# Patient Record
Sex: Male | Born: 1960 | Race: White | Hispanic: No | Marital: Married | Smoking: Never smoker
Health system: Southern US, Community
[De-identification: ages and names within clinical notes are randomized; demographics above are authoritative.]

## PROBLEM LIST (undated history)

## (undated) DIAGNOSIS — N200 Calculus of kidney: Secondary | ICD-10-CM

## (undated) DIAGNOSIS — M1990 Unspecified osteoarthritis, unspecified site: Secondary | ICD-10-CM

## (undated) DIAGNOSIS — R42 Dizziness and giddiness: Secondary | ICD-10-CM

## (undated) HISTORY — DX: Calculus of kidney: N20.0

## (undated) HISTORY — DX: Dizziness and giddiness: R42

## (undated) HISTORY — DX: Unspecified osteoarthritis, unspecified site: M19.90

---

## 2014-03-26 ENCOUNTER — Ambulatory Visit: Payer: Self-pay | Admitting: Gastroenterology

## 2014-03-26 HISTORY — PX: COLONOSCOPY W/ POLYPECTOMY: SHX1380

## 2014-03-26 LAB — HM COLONOSCOPY

## 2015-10-07 ENCOUNTER — Encounter: Payer: Self-pay | Admitting: Family Medicine

## 2015-10-14 ENCOUNTER — Ambulatory Visit (INDEPENDENT_AMBULATORY_CARE_PROVIDER_SITE_OTHER): Payer: BC Managed Care – PPO | Admitting: Family Medicine

## 2015-10-14 ENCOUNTER — Encounter: Payer: Self-pay | Admitting: Family Medicine

## 2015-10-14 VITALS — BP 159/97 | HR 78 | Temp 97.7°F | Ht 68.5 in | Wt 199.0 lb

## 2015-10-14 DIAGNOSIS — Z13220 Encounter for screening for lipoid disorders: Secondary | ICD-10-CM

## 2015-10-14 DIAGNOSIS — Z0000 Encounter for general adult medical examination without abnormal findings: Secondary | ICD-10-CM

## 2015-10-14 DIAGNOSIS — Z1159 Encounter for screening for other viral diseases: Secondary | ICD-10-CM | POA: Diagnosis not present

## 2015-10-14 DIAGNOSIS — Z114 Encounter for screening for human immunodeficiency virus [HIV]: Secondary | ICD-10-CM | POA: Diagnosis not present

## 2015-10-14 DIAGNOSIS — Z131 Encounter for screening for diabetes mellitus: Secondary | ICD-10-CM | POA: Diagnosis not present

## 2015-10-14 DIAGNOSIS — Z1329 Encounter for screening for other suspected endocrine disorder: Secondary | ICD-10-CM

## 2015-10-14 LAB — UA/M W/RFLX CULTURE, ROUTINE
BILIRUBIN UA: NEGATIVE
GLUCOSE, UA: NEGATIVE
KETONES UA: NEGATIVE
Leukocytes, UA: NEGATIVE
NITRITE UA: NEGATIVE
Protein, UA: NEGATIVE
RBC, UA: NEGATIVE
Specific Gravity, UA: 1.01 (ref 1.005–1.030)
UUROB: 0.2 mg/dL (ref 0.2–1.0)
pH, UA: 5.5 (ref 5.0–7.5)

## 2015-10-14 NOTE — Patient Instructions (Signed)
Follow up as needed

## 2015-10-14 NOTE — Progress Notes (Signed)
BP (!) 159/97   Pulse 78   Temp 97.7 F (36.5 C)   Ht 5' 8.5" (1.74 m)   Wt 199 lb (90.3 kg)   SpO2 97%   BMI 29.82 kg/m    Subjective:    Patient ID: Chase Brooks, male    DOB: 01-31-1960, 55 y.o.   MRN: [id number]  HPI: Chase Brooks is a 54 y.o. male presenting on 10/14/2015 for comprehensive medical examination. Current medical complaints include:none  He currently lives with: wife Interim Problems from his last visit: no  Functional Status Survey:    FALL RISK: No flowsheet data found.  Depression Screen Depression screen [facility] 2/9 10/14/2015  Decreased Interest 0  Down, Depressed, Hopeless 0  PHQ - 2 Score 0    Advanced Directives <no information>  Past Medical History:  History reviewed. No pertinent past medical history.  Surgical History:  Past Surgical History:  Procedure Laterality Date  . COLONOSCOPY W/ POLYPECTOMY  03/26/2014   polypectomy x 2, repeat 5 years    Medications:  No current outpatient prescriptions on file prior to visit.   No current facility-administered medications on file prior to visit.     Allergies:  No Known Allergies  Social History:  Social History   Social History  . Marital status: Single    Spouse name: N/A  . Number of children: N/A  . Years of education: N/A   Occupational History  . Not on file.   Social History Main Topics  . Smoking status: Never Smoker  . Smokeless tobacco: Never Used  . Alcohol use No  . Drug use: No  . Sexual activity: Not on file   Other Topics Concern  . Not on file   Social History Narrative  . No narrative on file   History  Smoking Status  . Never Smoker  Smokeless Tobacco  . Never Used   History  Alcohol Use No    Family History:  Family History  Problem Relation Age of Onset  . Intracerebral hemorrhage Mother   . Cancer Neg Hx   . COPD Neg Hx   . Diabetes Neg Hx   . Heart disease Neg Hx   . Hypertension Neg Hx   . Stroke Neg Hx     Past  medical history, surgical history, medications, allergies, family history and social history reviewed with patient today and changes made to appropriate areas of the chart.   Review of Systems - General ROS: negative Psychological ROS: negative Ophthalmic ROS: negative ENT ROS: negative Respiratory ROS: no cough, shortness of breath, or wheezing Cardiovascular ROS: no chest pain or dyspnea on exertion Gastrointestinal ROS: no abdominal pain, change in bowel habits, or black or bloody stools Genito-Urinary ROS: no dysuria, trouble voiding, or hematuria Musculoskeletal ROS: negative Neurological ROS: no TIA or stroke symptoms Dermatological ROS: negative All other ROS negative except what is listed above and in the HPI.      Objective:    BP (!) 159/97   Pulse 78   Temp 97.7 F (36.5 C)   Ht 5' 8.5" (1.74 m)   Wt 199 lb (90.3 kg)   SpO2 97%   BMI 29.82 kg/m   Wt Readings from Last 3 Encounters:  10/14/15 199 lb (90.3 kg)  02/24/14 205 lb (93 kg)    Physical Exam  Constitutional: He is oriented to person, place, and time. He appears well-developed and well-nourished. No distress.  HENT:  Head: Atraumatic.  Right Ear: External  ear normal.  Left Ear: External ear normal.  Nose: Nose normal.  Mouth/Throat: Oropharynx is clear and moist. No oropharyngeal exudate.  Eyes: Conjunctivae are normal. No scleral icterus.  Neck: Normal range of motion. Neck supple. No thyromegaly present.  No carotid bruits  Cardiovascular: Normal rate, regular rhythm and normal heart sounds.   Pulmonary/Chest: Effort normal and breath sounds normal. No respiratory distress.  Abdominal: Soft. Bowel sounds are normal. He exhibits no distension. There is no tenderness.  Musculoskeletal: Normal range of motion. He exhibits no edema or tenderness.  Lymphadenopathy:    He has no cervical adenopathy.  Neurological: He is alert and oriented to person, place, and time. No cranial nerve deficit.  Skin: Skin  is warm and dry.  Psychiatric: He has a normal mood and affect. His behavior is normal.    Cognitive Testing - 6-CIT  Correct? Score   What year is it? yes 4 Yes = 0    No = 4  What month is it? yes 4 Yes = 0    No = 3  Remember:     Floyde Parkins, [address], [place]     What time is it? yes 4 Yes = 0    No = 3  Count backwards from 20 to 1 yes 4 Correct = 0    1 error = 2   More than 1 error = 4  Say the months of the year in reverse. yes 4 Correct = 0    1 error = 2   More than 1 error = 4  What address did I ask you to remember? yes 10 Correct = 0  1 error = 2    2 error = 4    3 error = 6    4 error = 8    All wrong = 10       TOTAL SCORE  30/28   Interpretation:  Normal  Normal (0-7) Abnormal (8-28)    Results for orders placed or performed in visit on 10/14/15  UA/M w/rflx Culture, Routine  Result Value Ref Range   Specific Gravity, UA 1.010 1.005 - 1.030   pH, UA 5.5 5.0 - 7.5   Color, UA Yellow Yellow   Appearance Ur Clear Clear   Leukocytes, UA Negative Negative   Protein, UA Negative Negative/Trace   Glucose, UA Negative Negative   Ketones, UA Negative Negative   RBC, UA Negative Negative   Bilirubin, UA Negative Negative   Urobilinogen, Ur 0.2 0.2 - 1.0 mg/dL   Nitrite, UA Negative Negative      Assessment & Plan:   Problem List Items Addressed This Visit    None    Visit Diagnoses    Annual physical exam    -  Primary   Await labs, exam benign today.    Relevant Orders   CBC with Differential/Platelet   Need for hepatitis C screening test       Relevant Orders   Hepatitis C Antibody   Encounter for screening for HIV       Relevant Orders   HIV antibody   Screening for diabetes mellitus       Relevant Orders   Comprehensive metabolic panel   UA/M w/rflx Culture, Routine (Completed)   HgB A1c   Screening for thyroid disorder       Relevant Orders   TSH   Screening for hyperlipidemia       Relevant Orders   Lipid Panel  w/o Chol/HDL Ratio        Preventative Services:  Health Risk Assessment and Personalized Prevention Plan: Bone Mass Measurements: CVD Screening:  Colon Cancer Screening:  Depression Screening:  Diabetes Screening:  Glaucoma Screening:  Hepatitis B vaccine: Hepatitis C screening:  HIV Screening: Flu Vaccine: Lung cancer Screening: Obesity Screening:  Pneumonia Vaccines (2): STI Screening: PSA screening:  Discussed aspirin prophylaxis for myocardial infarction prevention and decision was it was not indicated  LABORATORY TESTING:  Health maintenance labs ordered today as discussed above.   The natural history of prostate cancer and ongoing controversy regarding screening and potential treatment outcomes of prostate cancer has been discussed with the patient. The meaning of a false positive PSA and a false negative PSA has been discussed. He indicates understanding of the limitations of this screening test and wishes not to proceed with screening PSA testing.   IMMUNIZATIONS:   - Tdap: Tetanus vaccination status reviewed: last tetanus booster within 10 years. - Influenza: Refused  SCREENING: - Colonoscopy: Up to date  Discussed with patient purpose of the colonoscopy is to detect colon cancer at curable precancerous or early stages    PATIENT COUNSELING:    Sexuality: Discussed sexually transmitted diseases, partner selection, use of condoms, avoidance of unintended pregnancy  and contraceptive alternatives.   Advised to avoid cigarette smoking.  I discussed with the patient that most people either abstain from alcohol or drink within safe limits (<=14/week and <=4 drinks/occasion for males, <=7/weeks and <= 3 drinks/occasion for females) and that the risk for alcohol disorders and other health effects rises proportionally with the number of drinks per week and how often a drinker exceeds daily limits.  Discussed cessation/primary prevention of drug use and availability of treatment for abuse.    Diet: Encouraged to adjust caloric intake to maintain  or achieve ideal body weight, to reduce intake of dietary saturated fat and total fat, to limit sodium intake by avoiding high sodium foods and not adding table salt, and to maintain adequate dietary potassium and calcium preferably from fresh fruits, vegetables, and low-fat dairy products.    stressed the importance of regular exercise  Injury prevention: Discussed safety belts, safety helmets, smoke detector, smoking near bedding or upholstery.   Dental health: Discussed importance of regular tooth brushing, flossing, and dental visits.   Follow up plan: NEXT PREVENTATIVE PHYSICAL DUE IN 1 YEAR. Return in about 1 year (around 10/13/2016) for Physical Exam.

## 2015-10-15 LAB — COMPREHENSIVE METABOLIC PANEL
ALT: 12 IU/L (ref 0–44)
AST: 21 IU/L (ref 0–40)
Albumin/Globulin Ratio: 1.6 (ref 1.2–2.2)
Albumin: 4.7 g/dL (ref 3.5–5.5)
Alkaline Phosphatase: 95 IU/L (ref 39–117)
BILIRUBIN TOTAL: 0.5 mg/dL (ref 0.0–1.2)
BUN/Creatinine Ratio: 12 (ref 9–20)
BUN: 13 mg/dL (ref 6–24)
CALCIUM: 9.4 mg/dL (ref 8.7–10.2)
CHLORIDE: 98 mmol/L (ref 96–106)
CO2: 22 mmol/L (ref 18–29)
Creatinine, Ser: 1.07 mg/dL (ref 0.76–1.27)
GFR calc non Af Amer: 79 mL/min/{1.73_m2} (ref >59)
GFR, EST AFRICAN AMERICAN: 91 mL/min/{1.73_m2} (ref >59)
Globulin, Total: 2.9 g/dL (ref 1.5–4.5)
Glucose: 76 mg/dL (ref 65–99)
Potassium: 4.8 mmol/L (ref 3.5–5.2)
Sodium: 140 mmol/L (ref 134–144)
TOTAL PROTEIN: 7.6 g/dL (ref 6.0–8.5)

## 2015-10-15 LAB — HEMOGLOBIN A1C
Est. average glucose Bld gHb Est-mCnc: 117 mg/dL
HEMOGLOBIN A1C: 5.7 % — AB (ref 4.8–5.6)

## 2015-10-15 LAB — CBC WITH DIFFERENTIAL/PLATELET
BASOS: 1 %
Basophils Absolute: 0 10*3/uL (ref 0.0–0.2)
EOS (ABSOLUTE): 0.1 10*3/uL (ref 0.0–0.4)
Eos: 2 %
HEMOGLOBIN: 16.8 g/dL (ref 12.6–17.7)
Hematocrit: 47.4 % (ref 37.5–51.0)
IMMATURE GRANS (ABS): 0 10*3/uL (ref 0.0–0.1)
Immature Granulocytes: 0 %
LYMPHS: 33 %
Lymphocytes Absolute: 2.2 10*3/uL (ref 0.7–3.1)
MCH: 32.4 pg (ref 26.6–33.0)
MCHC: 35.4 g/dL (ref 31.5–35.7)
MCV: 91 fL (ref 79–97)
Monocytes Absolute: 0.6 10*3/uL (ref 0.1–0.9)
Monocytes: 9 %
NEUTROS ABS: 3.7 10*3/uL (ref 1.4–7.0)
Neutrophils: 55 %
PLATELETS: 234 10*3/uL (ref 150–379)
RBC: 5.19 x10E6/uL (ref 4.14–5.80)
RDW: 13 % (ref 12.3–15.4)
WBC: 6.6 10*3/uL (ref 3.4–10.8)

## 2015-10-15 LAB — HEPATITIS C ANTIBODY: Hep C Virus Ab: <0.1 s/co ratio (ref 0.0–0.9)

## 2015-10-15 LAB — TSH: TSH: 1.93 u[IU]/mL (ref 0.450–4.500)

## 2015-10-15 LAB — LIPID PANEL W/O CHOL/HDL RATIO
Cholesterol, Total: 182 mg/dL (ref 100–199)
HDL: 44 mg/dL (ref >39)
LDL Calculated: 115 mg/dL — ABNORMAL HIGH (ref 0–99)
Triglycerides: 117 mg/dL (ref 0–149)
VLDL CHOLESTEROL CAL: 23 mg/dL (ref 5–40)

## 2015-10-15 LAB — HIV ANTIBODY (ROUTINE TESTING W REFLEX): HIV SCREEN 4TH GENERATION: NONREACTIVE

## 2015-10-17 ENCOUNTER — Encounter: Payer: Self-pay | Admitting: Family Medicine

## 2016-06-26 ENCOUNTER — Encounter: Payer: Self-pay | Admitting: Family Medicine

## 2016-06-26 ENCOUNTER — Ambulatory Visit (INDEPENDENT_AMBULATORY_CARE_PROVIDER_SITE_OTHER): Payer: BC Managed Care – PPO | Admitting: Family Medicine

## 2016-06-26 VITALS — BP 151/95 | HR 79 | Temp 97.9°F | Wt 203.0 lb

## 2016-06-26 DIAGNOSIS — M25512 Pain in left shoulder: Secondary | ICD-10-CM

## 2016-06-26 MED ORDER — CYCLOBENZAPRINE HCL 10 MG PO TABS: 10.0000 mg | ORAL_TABLET | Freq: Three times a day (TID) | ORAL | 0 refills | Status: DC | PRN

## 2016-06-26 NOTE — Progress Notes (Signed)
BP (!) 151/95   Pulse 79   Temp 97.9 F (36.6 C)   Wt 203 lb (92.1 kg)   SpO2 98%   BMI 30.42 kg/m    Subjective:    Patient ID: Chase Brooks, male    DOB: Apr 08, 1960, 56 y.o.   MRN: [id number]  HPI: Chase Brooks is a 56 y.o. male  Chief Complaint  Patient presents with  . Underarm pain    x 1 month, left axillary area was tender/sore. Hasn't felt a knot. Has improved now, almost gone away.    Patient with 1 month of left axillary/chest wall tenderness. Denies known injury, mass, redness, rashes. Has improved quite a bit the past few days and is barely noticeable. Does also note some "crackling noises" when he moves that shoulder. Golfs quite a bit and was shoveling in the yard frequently the past month. Has not been taking anything for comfort.   Relevant past medical, surgical, family and social history reviewed and updated as indicated. Interim medical history since our last visit reviewed. Allergies and medications reviewed and updated.  Review of Systems  Constitutional: Negative.   HENT: Negative.   Respiratory: Negative.   Cardiovascular: Negative.   Gastrointestinal: Negative.   Genitourinary: Negative.   Musculoskeletal: Negative.   Skin: Negative.   Neurological: Negative.   Psychiatric/Behavioral: Negative.    Per HPI unless specifically indicated above     Objective:    BP (!) 151/95   Pulse 79   Temp 97.9 F (36.6 C)   Wt 203 lb (92.1 kg)   SpO2 98%   BMI 30.42 kg/m   Wt Readings from Last 3 Encounters:  06/26/16 203 lb (92.1 kg)  10/14/15 199 lb (90.3 kg)  02/24/14 205 lb (93 kg)    Physical Exam  Constitutional: He is oriented to person, place, and time. He appears well-developed and well-nourished. No distress.  HENT:  Head: Atraumatic.  Eyes: Conjunctivae are normal. Pupils are equal, round, and reactive to light.  Neck: Normal range of motion. Neck supple.  Cardiovascular: Normal rate and normal heart sounds.     Pulmonary/Chest: Effort normal and breath sounds normal. No respiratory distress.  Musculoskeletal: Normal range of motion. He exhibits no edema, tenderness (No TTP in area pt describes pain) or deformity.  Good strength and ROM in B/l UEs  Lymphadenopathy:  No palpable lymphadenopathy left axilla  Neurological: He is alert and oriented to person, place, and time.  Skin: Skin is warm and dry.  Psychiatric: He has a normal mood and affect. His behavior is normal.  Nursing note and vitals reviewed.   Results for orders placed or performed in visit on 06/26/16  CBC with Differential/Platelet  Result Value Ref Range   WBC 8.1 3.4 - 10.8 x10E3/uL   RBC 4.99 4.14 - 5.80 x10E6/uL   Hemoglobin 16.1 13.0 - 17.7 g/dL   Hematocrit 40.945.6 81.137.5 - 51.0 %   MCV 91 79 - 97 fL   MCH 32.3 26.6 - 33.0 pg   MCHC 35.3 31.5 - 35.7 g/dL   RDW 91.413.5 78.212.3 - 95.615.4 %   Platelets 215 150 - 379 x10E3/uL   Neutrophils 68 Not Estab. %   Lymphs 23 Not Estab. %   Monocytes 8 Not Estab. %   Eos 1 Not Estab. %   Basos 0 Not Estab. %   Neutrophils Absolute 5.4 1.4 - 7.0 x10E3/uL   Lymphocytes Absolute 1.9 0.7 - 3.1 x10E3/uL   Monocytes Absolute 0.7  0.1 - 0.9 x10E3/uL   EOS (ABSOLUTE) 0.1 0.0 - 0.4 x10E3/uL   Basophils Absolute 0.0 0.0 - 0.2 x10E3/uL   Immature Granulocytes 0 Not Estab. %   Immature Grans (Abs) 0.0 0.0 - 0.1 x10E3/uL      Assessment & Plan:   Problem List Items Addressed This Visit    None    Visit Diagnoses    Left anterior shoulder pain    -  Primary   Probable healing muscle strain - will send flexeril, stretches, massage, rest. Will check CBC as pt concerned about lymph nodes and such. F/u if no improvement   Relevant Orders   CBC with Differential/Platelet (Completed)       Follow up plan: Return if symptoms worsen or fail to improve.

## 2016-06-27 LAB — CBC WITH DIFFERENTIAL/PLATELET
BASOS ABS: 0 10*3/uL (ref 0.0–0.2)
Basos: 0 %
EOS (ABSOLUTE): 0.1 10*3/uL (ref 0.0–0.4)
EOS: 1 %
HEMATOCRIT: 45.6 % (ref 37.5–51.0)
HEMOGLOBIN: 16.1 g/dL (ref 13.0–17.7)
Immature Grans (Abs): 0 10*3/uL (ref 0.0–0.1)
Immature Granulocytes: 0 %
LYMPHS ABS: 1.9 10*3/uL (ref 0.7–3.1)
Lymphs: 23 %
MCH: 32.3 pg (ref 26.6–33.0)
MCHC: 35.3 g/dL (ref 31.5–35.7)
MCV: 91 fL (ref 79–97)
MONOCYTES: 8 %
Monocytes Absolute: 0.7 10*3/uL (ref 0.1–0.9)
NEUTROS ABS: 5.4 10*3/uL (ref 1.4–7.0)
Neutrophils: 68 %
Platelets: 215 10*3/uL (ref 150–379)
RBC: 4.99 x10E6/uL (ref 4.14–5.80)
RDW: 13.5 % (ref 12.3–15.4)
WBC: 8.1 10*3/uL (ref 3.4–10.8)

## 2016-10-19 ENCOUNTER — Encounter: Payer: Self-pay | Admitting: Family Medicine

## 2016-10-19 ENCOUNTER — Ambulatory Visit (INDEPENDENT_AMBULATORY_CARE_PROVIDER_SITE_OTHER): Payer: BC Managed Care – PPO | Admitting: Family Medicine

## 2016-10-19 VITALS — BP 142/96 | HR 78 | Ht 69.5 in | Wt 199.0 lb

## 2016-10-19 DIAGNOSIS — Z0000 Encounter for general adult medical examination without abnormal findings: Secondary | ICD-10-CM | POA: Diagnosis not present

## 2016-10-19 DIAGNOSIS — R030 Elevated blood-pressure reading, without diagnosis of hypertension: Secondary | ICD-10-CM

## 2016-10-19 LAB — MICROSCOPIC EXAMINATION: Bacteria, UA: NONE SEEN

## 2016-10-19 LAB — HEMOCCULT GUIAC POC 1CARD (OFFICE)

## 2016-10-19 MED ORDER — OXYCODONE-ACETAMINOPHEN 5-325 MG PO TABS: 1.0000 | ORAL_TABLET | Freq: Three times a day (TID) | ORAL | 0 refills | Status: DC | PRN

## 2016-10-19 MED ORDER — SULFAMETHOXAZOLE-TRIMETHOPRIM 800-160 MG PO TABS: 1.0000 | ORAL_TABLET | Freq: Two times a day (BID) | ORAL | 0 refills | Status: DC

## 2016-10-19 NOTE — Patient Instructions (Addendum)
Follow up in 6 months  DASH Eating Plan DASH stands for "Dietary Approaches to Stop Hypertension." The DASH eating plan is a healthy eating plan that has been shown to reduce high blood pressure (hypertension). It may also reduce your risk for type 2 diabetes, heart disease, and stroke. The DASH eating plan may also help with weight loss. What are tips for following this plan? General guidelines  Avoid eating more than 2,300 mg (milligrams) of salt (sodium) a day. If you have hypertension, you may need to reduce your sodium intake to 1,500 mg a day.  Limit alcohol intake to no more than 1 drink a day for nonpregnant women and 2 drinks a day for men. One drink equals 12 oz of beer, 5 oz of wine, or 1 oz of hard liquor.  Work with your health care provider to maintain a healthy body weight or to lose weight. Ask what an ideal weight is for you.  Get at least 30 minutes of exercise that causes your heart to beat faster (aerobic exercise) most days of the week. Activities may include walking, swimming, or biking.  Work with your health care provider or diet and nutrition specialist (dietitian) to adjust your eating plan to your individual calorie needs. Reading food labels  Check food labels for the amount of sodium per serving. Choose foods with less than 5 percent of the Daily Value of sodium. Generally, foods with less than 300 mg of sodium per serving fit into this eating plan.  To find whole grains, look for the word "whole" as the first word in the ingredient list. Shopping  Buy products labeled as "low-sodium" or "no salt added."  Buy fresh foods. Avoid canned foods and premade or frozen meals. Cooking  Avoid adding salt when cooking. Use salt-free seasonings or herbs instead of table salt or sea salt. Check with your health care provider or pharmacist before using salt substitutes.  Do not fry foods. Cook foods using healthy methods such as baking, boiling, grilling, and broiling  instead.  Cook with heart-healthy oils, such as olive, canola, soybean, or sunflower oil. Meal planning   Eat a balanced diet that includes: ? 5 or more servings of fruits and vegetables each day. At each meal, try to fill half of your plate with fruits and vegetables. ? Up to 6-8 servings of whole grains each day. ? Less than 6 oz of lean meat, poultry, or fish each day. A 3-oz serving of meat is about the same size as a deck of cards. One egg equals 1 oz. ? 2 servings of low-fat dairy each day. ? A serving of nuts, seeds, or beans 5 times each week. ? Heart-healthy fats. Healthy fats called Omega-3 fatty acids are found in foods such as flaxseeds and coldwater fish, like sardines, salmon, and mackerel.  Limit how much you eat of the following: ? Canned or prepackaged foods. ? Food that is high in trans fat, such as fried foods. ? Food that is high in saturated fat, such as fatty meat. ? Sweets, desserts, sugary drinks, and other foods with added sugar. ? Full-fat dairy products.  Do not salt foods before eating.  Try to eat at least 2 vegetarian meals each week.  Eat more home-cooked food and less restaurant, buffet, and fast food.  When eating at a restaurant, ask that your food be prepared with less salt or no salt, if possible. What foods are recommended? The items listed may not be a complete list.  Talk with your dietitian about what dietary choices are best for you. Grains Whole-grain or whole-wheat bread. Whole-grain or whole-wheat pasta. Brown rice. Orpah Cobb. Bulgur. Whole-grain and low-sodium cereals. Pita bread. Low-fat, low-sodium crackers. Whole-wheat flour tortillas. Vegetables Fresh or frozen vegetables (raw, steamed, roasted, or grilled). Low-sodium or reduced-sodium tomato and vegetable juice. Low-sodium or reduced-sodium tomato sauce and tomato paste. Low-sodium or reduced-sodium canned vegetables. Fruits All fresh, dried, or frozen fruit. Canned fruit in  natural juice (without added sugar). Meat and other protein foods Skinless chicken or [place]. Ground chicken or [place]. Pork with fat trimmed off. Fish and seafood. Egg whites. Dried beans, peas, or lentils. Unsalted nuts, nut butters, and seeds. Unsalted canned beans. Lean cuts of beef with fat trimmed off. Low-sodium, lean deli meat. Dairy Low-fat (1%) or fat-free (skim) milk. Fat-free, low-fat, or reduced-fat cheeses. Nonfat, low-sodium ricotta or cottage cheese. Low-fat or nonfat yogurt. Low-fat, low-sodium cheese. Fats and oils Soft margarine without trans fats. Vegetable oil. Low-fat, reduced-fat, or light mayonnaise and salad dressings (reduced-sodium). Canola, safflower, olive, soybean, and sunflower oils. Avocado. Seasoning and other foods Herbs. Spices. Seasoning mixes without salt. Unsalted popcorn and pretzels. Fat-free sweets. What foods are not recommended? The items listed may not be a complete list. Talk with your dietitian about what dietary choices are best for you. Grains Baked goods made with fat, such as croissants, muffins, or some breads. Dry pasta or rice meal packs. Vegetables Creamed or fried vegetables. Vegetables in a cheese sauce. Regular canned vegetables (not low-sodium or reduced-sodium). Regular canned tomato sauce and paste (not low-sodium or reduced-sodium). Regular tomato and vegetable juice (not low-sodium or reduced-sodium). Rosita Fire. Olives. Fruits Canned fruit in a light or heavy syrup. Fried fruit. Fruit in cream or butter sauce. Meat and other protein foods Fatty cuts of meat. Ribs. Fried meat. Tomasa Blase. Sausage. Bologna and other processed lunch meats. Salami. Fatback. Hotdogs. Bratwurst. Salted nuts and seeds. Canned beans with added salt. Canned or smoked fish. Whole eggs or egg yolks. Chicken or [place] with skin. Dairy Whole or 2% milk, cream, and half-and-half. Whole or full-fat cream cheese. Whole-fat or sweetened yogurt. Full-fat cheese. Nondairy  creamers. Whipped toppings. Processed cheese and cheese spreads. Fats and oils Butter. Stick margarine. Lard. Shortening. Ghee. Bacon fat. Tropical oils, such as coconut, palm kernel, or palm oil. Seasoning and other foods Salted popcorn and pretzels. Onion salt, garlic salt, seasoned salt, table salt, and sea salt. Worcestershire sauce. Tartar sauce. Barbecue sauce. Teriyaki sauce. Soy sauce, including reduced-sodium. Steak sauce. Canned and packaged gravies. Fish sauce. Oyster sauce. Cocktail sauce. Horseradish that you find on the shelf. Ketchup. Mustard. Meat flavorings and tenderizers. Bouillon cubes. Hot sauce and Tabasco sauce. Premade or packaged marinades. Premade or packaged taco seasonings. Relishes. Regular salad dressings.   Where to find more information:  National Heart, Lung, and Blood Institute: PopSteam.is  American Heart Association: www.heart.org Summary  The DASH eating plan is a healthy eating plan that has been shown to reduce high blood pressure (hypertension). It may also reduce your risk for type 2 diabetes, heart disease, and stroke.  With the DASH eating plan, you should limit salt (sodium) intake to 2,300 mg a day. If you have hypertension, you may need to reduce your sodium intake to 1,500 mg a day.  When on the DASH eating plan, aim to eat more fresh fruits and vegetables, whole grains, lean proteins, low-fat dairy, and heart-healthy fats.  Work with your health care provider or diet and nutrition specialist (dietitian)  to adjust your eating plan to your individual calorie needs. This information is not intended to replace advice given to you by your health care provider. Make sure you discuss any questions you have with your health care provider. Document Released: 12/21/2010 Document Revised: 12/26/2015 Document Reviewed: 12/26/2015 Elsevier Interactive Patient Education  2017 [employer].

## 2016-10-19 NOTE — Progress Notes (Signed)
BP (!) 142/96   Pulse 78   Ht 5' 9.5" (1.765 m)   Wt 199 lb (90.3 kg)   SpO2 96%   BMI 28.97 kg/m    Subjective:    Patient ID: Chase Brooks, male    DOB: April 09, 1960, 56 y.o.   MRN: [id number]  HPI: Kahron Kauth is a 56 y.o. male presenting on 10/19/2016 for comprehensive medical examination. Current medical complaints include:none  Did eat breakfast today. Checks BPs at home. Readings are 110-120s/70-80s at home.   Interim Problems from his last visit: no  Depression Screen done today and results listed below:  Depression screen [facility] 2/9 10/19/2016 10/14/2015  Decreased Interest 0 0  Down, Depressed, Hopeless 0 0  PHQ - 2 Score 0 0    The patient does not have a history of falls. I did not complete a risk assessment for falls. A plan of care for falls was not documented.   Past Medical History:  No past medical history on file.  Surgical History:  Past Surgical History:  Procedure Laterality Date  . COLONOSCOPY W/ POLYPECTOMY  03/26/2014   polypectomy x 2, repeat 5 years    Medications:  Current Outpatient Prescriptions on File Prior to Visit  Medication Sig  . Cyanocobalamin (B-12 PO) Take 500 mcg by mouth daily.    No current facility-administered medications on file prior to visit.     Allergies:  No Known Allergies  Social History:  Social History   Social History  . Marital status: Single    Spouse name: N/A  . Number of children: N/A  . Years of education: N/A   Occupational History  . Not on file.   Social History Main Topics  . Smoking status: Never Smoker  . Smokeless tobacco: Never Used  . Alcohol use No  . Drug use: No  . Sexual activity: Not on file   Other Topics Concern  . Not on file   Social History Narrative  . No narrative on file   History  Smoking Status  . Never Smoker  Smokeless Tobacco  . Never Used   History  Alcohol Use No    Family History:  Family History  Problem Relation Age of Onset  .  Intracerebral hemorrhage Mother   . Cancer Neg Hx   . COPD Neg Hx   . Diabetes Neg Hx   . Heart disease Neg Hx   . Hypertension Neg Hx   . Stroke Neg Hx     Past medical history, surgical history, medications, allergies, family history and social history reviewed with patient today and changes made to appropriate areas of the chart.   Review of Systems - General ROS: negative Psychological ROS: negative Ophthalmic ROS: negative ENT ROS: negative Respiratory ROS: no cough, shortness of breath, or wheezing Cardiovascular ROS: no chest pain or dyspnea on exertion Gastrointestinal ROS: no abdominal pain, change in bowel habits, or black or bloody stools Genito-Urinary ROS: no dysuria, trouble voiding, or hematuria Musculoskeletal ROS: negative Neurological ROS: no TIA or stroke symptoms Dermatological ROS: negative All other ROS negative except what is listed above and in the HPI.      Objective:    BP (!) 142/96   Pulse 78   Ht 5' 9.5" (1.765 m)   Wt 199 lb (90.3 kg)   SpO2 96%   BMI 28.97 kg/m   Wt Readings from Last 3 Encounters:  10/19/16 199 lb (90.3 kg)  06/26/16 203 lb (92.1 kg)  10/14/15 199 lb (90.3 kg)    Physical Exam  Constitutional: He is oriented to person, place, and time. He appears well-developed and well-nourished. No distress.  HENT:  Head: Atraumatic.  Right Ear: External ear normal.  Left Ear: External ear normal.  Nose: Nose normal.  Mouth/Throat: Oropharynx is clear and moist.  Eyes: Pupils are equal, round, and reactive to light. Conjunctivae are normal. No scleral icterus.  Neck: Normal range of motion. Neck supple.  Cardiovascular: Normal rate, regular rhythm, normal heart sounds and intact distal pulses.   No murmur heard. Pulmonary/Chest: Effort normal and breath sounds normal. No respiratory distress.  Abdominal: Soft. Bowel sounds are normal. He exhibits no distension and no mass. There is no tenderness. There is no guarding.    Genitourinary: Rectum normal and prostate normal.  Musculoskeletal: Normal range of motion. He exhibits no edema or tenderness.  Neurological: He is alert and oriented to person, place, and time. He has normal reflexes.  Skin: Skin is warm and dry. No rash noted.  Psychiatric: He has a normal mood and affect. His behavior is normal.  Nursing note and vitals reviewed.  Results for orders placed or performed in visit on 10/19/16  Microscopic Examination  Result Value Ref Range   WBC, UA 6-10 (A) 0 - 5 /hpf   RBC, UA 3-10 (A) 0 - 2 /hpf   Epithelial Cells (non renal) CANCELED    Bacteria, UA None seen None seen/Few  Urine Culture, Reflex  Result Value Ref Range   Urine Culture, Routine Final report    Organism ID, Bacteria No growth   CBC with Differential/Platelet  Result Value Ref Range   WBC 7.5 3.4 - 10.8 x10E3/uL   RBC 5.07 4.14 - 5.80 x10E6/uL   Hemoglobin 16.2 13.0 - 17.7 g/dL   Hematocrit 45.4 09.8 - 51.0 %   MCV 93 79 - 97 fL   MCH 32.0 26.6 - 33.0 pg   MCHC 34.5 31.5 - 35.7 g/dL   RDW 11.9 14.7 - 82.9 %   Platelets 215 150 - 379 x10E3/uL   Neutrophils 64 Not Estab. %   Lymphs 26 Not Estab. %   Monocytes 9 Not Estab. %   Eos 1 Not Estab. %   Basos 0 Not Estab. %   Neutrophils Absolute 4.7 1.4 - 7.0 x10E3/uL   Lymphocytes Absolute 2.0 0.7 - 3.1 x10E3/uL   Monocytes Absolute 0.7 0.1 - 0.9 x10E3/uL   EOS (ABSOLUTE) 0.1 0.0 - 0.4 x10E3/uL   Basophils Absolute 0.0 0.0 - 0.2 x10E3/uL   Immature Granulocytes 0 Not Estab. %   Immature Grans (Abs) 0.0 0.0 - 0.1 x10E3/uL  Comprehensive metabolic panel  Result Value Ref Range   Glucose 78 65 - 99 mg/dL   BUN 12 6 - 24 mg/dL   Creatinine, Ser 5.62 0.76 - 1.27 mg/dL   GFR calc non Af Amer 87 >59 mL/min/1.73   GFR calc Af Amer 101 >59 mL/min/1.73   BUN/Creatinine Ratio 12 9 - 20   Sodium 141 134 - 144 mmol/L   Potassium 4.3 3.5 - 5.2 mmol/L   Chloride 105 96 - 106 mmol/L   CO2 22 20 - 29 mmol/L   Calcium 9.2 8.7 - 10.2  mg/dL   Total Protein 7.2 6.0 - 8.5 g/dL   Albumin 4.4 3.5 - 5.5 g/dL   Globulin, Total 2.8 1.5 - 4.5 g/dL   Albumin/Globulin Ratio 1.6 1.2 - 2.2   Bilirubin Total 0.5 0.0 - 1.2 mg/dL  Alkaline Phosphatase 90 39 - 117 IU/L   AST 20 0 - 40 IU/L   ALT 13 0 - 44 IU/L  Lipid Panel w/o Chol/HDL Ratio  Result Value Ref Range   Cholesterol, Total 165 100 - 199 mg/dL   Triglycerides 161 0 - 149 mg/dL   HDL 43 >09 mg/dL   VLDL Cholesterol Cal 21 5 - 40 mg/dL   LDL Calculated 604 (H) 0 - 99 mg/dL  UA/M w/rflx Culture, Routine  Result Value Ref Range   Specific Gravity, UA 1.025 1.005 - 1.030   pH, UA 6.0 5.0 - 7.5   Color, UA Yellow Yellow   Appearance Ur Cloudy (A) Clear   Leukocytes, UA Negative Negative   Protein, UA 1+ (A) Negative/Trace   Glucose, UA Negative Negative   Ketones, UA Negative Negative   RBC, UA Trace (A) Negative   Bilirubin, UA Negative Negative   Urobilinogen, Ur 0.2 0.2 - 1.0 mg/dL   Nitrite, UA Negative Negative   Microscopic Examination See below:    Urinalysis Reflex Comment   POCT Occult Blood Stool  Result Value Ref Range   Fecal Occult Blood, POC  Negative   Card #1 Date     Card #2 Fecal Occult Blod, POC     Card #2 Date     Card #3 Fecal Occult Blood, POC     Card #3 Date        Assessment & Plan:   Problem List Items Addressed This Visit    None    Visit Diagnoses    Annual physical exam    -  Primary   Non-fasting labs drawn. Hemocult minimally positive today, recheck at next visit, await CBC. Pt declines notice of frank blood in stools. UTD on colonoscopy   Relevant Orders   CBC with Differential/Platelet (Completed)   Comprehensive metabolic panel (Completed)   Lipid Panel w/o Chol/HDL Ratio (Completed)   UA/M w/rflx Culture, Routine (Completed)   POCT Occult Blood Stool (Completed)   Elevated BP without diagnosis of hypertension       Normal readings at home, pt declining low-dose BP meds. Will bring home machine to next visit to  check against manual cuff. Lifestyle modifications discussed       Discussed aspirin prophylaxis for myocardial infarction prevention and decision was made to continue ASA  LABORATORY TESTING:  Health maintenance labs ordered today as discussed above.   The natural history of prostate cancer and ongoing controversy regarding screening and potential treatment outcomes of prostate cancer has been discussed with the patient. The meaning of a false positive PSA and a false negative PSA has been discussed. He indicates understanding of the limitations of this screening test and wishes not to proceed with screening PSA testing.   IMMUNIZATIONS:   - Tdap: Tetanus vaccination status reviewed: last tetanus booster within 10 years. - Influenza: Refused  SCREENING: - Colonoscopy: Up to date  Discussed with patient purpose of the colonoscopy is to detect colon cancer at curable precancerous or early stages   PATIENT COUNSELING:    Sexuality: Discussed sexually transmitted diseases, partner selection, use of condoms, avoidance of unintended pregnancy  and contraceptive alternatives.   Advised to avoid cigarette smoking.  I discussed with the patient that most people either abstain from alcohol or drink within safe limits (<=14/week and <=4 drinks/occasion for males, <=7/weeks and <= 3 drinks/occasion for females) and that the risk for alcohol disorders and other health effects rises proportionally with the number of  drinks per week and how often a drinker exceeds daily limits.  Discussed cessation/primary prevention of drug use and availability of treatment for abuse.   Diet: Encouraged to adjust caloric intake to maintain  or achieve ideal body weight, to reduce intake of dietary saturated fat and total fat, to limit sodium intake by avoiding high sodium foods and not adding table salt, and to maintain adequate dietary potassium and calcium preferably from fresh fruits, vegetables, and low-fat  dairy products.    stressed the importance of regular exercise  Injury prevention: Discussed safety belts, safety helmets, smoke detector, smoking near bedding or upholstery.   Dental health: Discussed importance of regular tooth brushing, flossing, and dental visits.   Follow up plan: NEXT PREVENTATIVE PHYSICAL DUE IN 1 YEAR. Return in about 6 months (around 04/19/2017) for BP.

## 2016-10-20 LAB — CBC WITH DIFFERENTIAL/PLATELET
BASOS: 0 %
Basophils Absolute: 0 10*3/uL (ref 0.0–0.2)
EOS (ABSOLUTE): 0.1 10*3/uL (ref 0.0–0.4)
EOS: 1 %
HEMATOCRIT: 46.9 % (ref 37.5–51.0)
HEMOGLOBIN: 16.2 g/dL (ref 13.0–17.7)
Immature Grans (Abs): 0 10*3/uL (ref 0.0–0.1)
Immature Granulocytes: 0 %
LYMPHS: 26 %
Lymphocytes Absolute: 2 10*3/uL (ref 0.7–3.1)
MCH: 32 pg (ref 26.6–33.0)
MCHC: 34.5 g/dL (ref 31.5–35.7)
MCV: 93 fL (ref 79–97)
MONOS ABS: 0.7 10*3/uL (ref 0.1–0.9)
Monocytes: 9 %
NEUTROS PCT: 64 %
Neutrophils Absolute: 4.7 10*3/uL (ref 1.4–7.0)
Platelets: 215 10*3/uL (ref 150–379)
RBC: 5.07 x10E6/uL (ref 4.14–5.80)
RDW: 13.3 % (ref 12.3–15.4)
WBC: 7.5 10*3/uL (ref 3.4–10.8)

## 2016-10-20 LAB — LIPID PANEL W/O CHOL/HDL RATIO
Cholesterol, Total: 165 mg/dL (ref 100–199)
HDL: 43 mg/dL (ref >39)
LDL Calculated: 101 mg/dL — ABNORMAL HIGH (ref 0–99)
Triglycerides: 106 mg/dL (ref 0–149)
VLDL Cholesterol Cal: 21 mg/dL (ref 5–40)

## 2016-10-20 LAB — COMPREHENSIVE METABOLIC PANEL
ALBUMIN: 4.4 g/dL (ref 3.5–5.5)
ALK PHOS: 90 IU/L (ref 39–117)
ALT: 13 IU/L (ref 0–44)
AST: 20 IU/L (ref 0–40)
Albumin/Globulin Ratio: 1.6 (ref 1.2–2.2)
BUN / CREAT RATIO: 12 (ref 9–20)
BUN: 12 mg/dL (ref 6–24)
Bilirubin Total: 0.5 mg/dL (ref 0.0–1.2)
CALCIUM: 9.2 mg/dL (ref 8.7–10.2)
CO2: 22 mmol/L (ref 20–29)
CREATININE: 0.98 mg/dL (ref 0.76–1.27)
Chloride: 105 mmol/L (ref 96–106)
GFR calc Af Amer: 101 mL/min/{1.73_m2} (ref >59)
GFR, EST NON AFRICAN AMERICAN: 87 mL/min/{1.73_m2} (ref >59)
GLOBULIN, TOTAL: 2.8 g/dL (ref 1.5–4.5)
Glucose: 78 mg/dL (ref 65–99)
Potassium: 4.3 mmol/L (ref 3.5–5.2)
SODIUM: 141 mmol/L (ref 134–144)
Total Protein: 7.2 g/dL (ref 6.0–8.5)

## 2016-10-21 LAB — URINE CULTURE, REFLEX: ORGANISM ID, BACTERIA: NO GROWTH

## 2016-10-21 LAB — UA/M W/RFLX CULTURE, ROUTINE
BILIRUBIN UA: NEGATIVE
GLUCOSE, UA: NEGATIVE
KETONES UA: NEGATIVE
Leukocytes, UA: NEGATIVE
NITRITE UA: NEGATIVE
PH UA: 6 (ref 5.0–7.5)
SPEC GRAV UA: 1.025 (ref 1.005–1.030)
UUROB: 0.2 mg/dL (ref 0.2–1.0)

## 2017-03-07 ENCOUNTER — Emergency Department
Admission: EM | Admit: 2017-03-07 | Discharge: 2017-03-07 | Disposition: A | Discharge status: Home / Self Care | Payer: BC Managed Care – PPO | Attending: Emergency Medicine | Admitting: Emergency Medicine

## 2017-03-07 ENCOUNTER — Encounter: Payer: Self-pay | Admitting: Emergency Medicine

## 2017-03-07 ENCOUNTER — Other Ambulatory Visit: Payer: Self-pay

## 2017-03-07 DIAGNOSIS — R42 Dizziness and giddiness: Secondary | ICD-10-CM

## 2017-03-07 DIAGNOSIS — Z7982 Long term (current) use of aspirin: Secondary | ICD-10-CM | POA: Diagnosis not present

## 2017-03-07 DIAGNOSIS — Z79899 Other long term (current) drug therapy: Secondary | ICD-10-CM | POA: Diagnosis not present

## 2017-03-07 DIAGNOSIS — J0100 Acute maxillary sinusitis, unspecified: Secondary | ICD-10-CM

## 2017-03-07 LAB — COMPREHENSIVE METABOLIC PANEL
ALBUMIN: 4 g/dL (ref 3.5–5.0)
ALK PHOS: 101 U/L (ref 38–126)
ALT: 19 U/L (ref 17–63)
ANION GAP: 9 (ref 5–15)
AST: 28 U/L (ref 15–41)
BUN: 16 mg/dL (ref 6–20)
CO2: 26 mmol/L (ref 22–32)
CREATININE: 0.99 mg/dL (ref 0.61–1.24)
Calcium: 8.8 mg/dL — ABNORMAL LOW (ref 8.9–10.3)
Chloride: 105 mmol/L (ref 101–111)
GFR calc Af Amer: >60 mL/min (ref >60)
GFR calc non Af Amer: >60 mL/min (ref >60)
GLUCOSE: 112 mg/dL — AB (ref 65–99)
Potassium: 3.9 mmol/L (ref 3.5–5.1)
SODIUM: 140 mmol/L (ref 135–145)
Total Bilirubin: 0.8 mg/dL (ref 0.3–1.2)
Total Protein: 7.5 g/dL (ref 6.5–8.1)

## 2017-03-07 LAB — CBC
HEMATOCRIT: 50.3 % (ref 40.0–52.0)
HEMOGLOBIN: 17.2 g/dL (ref 13.0–18.0)
MCH: 31.8 pg (ref 26.0–34.0)
MCHC: 34.2 g/dL (ref 32.0–36.0)
MCV: 93.1 fL (ref 80.0–100.0)
Platelets: 223 10*3/uL (ref 150–440)
RBC: 5.4 MIL/uL (ref 4.40–5.90)
RDW: 12.4 % (ref 11.5–14.5)
WBC: 13.6 10*3/uL — AB (ref 3.8–10.6)

## 2017-03-07 LAB — TROPONIN I: Troponin I: <0.03 ng/mL (ref <0.03)

## 2017-03-07 MED ORDER — MECLIZINE HCL 25 MG PO TABS: 25.0000 mg | ORAL_TABLET | Freq: Three times a day (TID) | ORAL | 0 refills | Status: DC | PRN

## 2017-03-07 MED ORDER — AMOXICILLIN-POT CLAVULANATE 875-125 MG PO TABS: 1.0000 | ORAL_TABLET | Freq: Two times a day (BID) | ORAL | 0 refills | Status: AC

## 2017-03-07 MED ORDER — SODIUM CHLORIDE 0.9 % IV SOLN
1000.0000 mL | Freq: Once | INTRAVENOUS | Status: AC
  Administered 2017-03-07: 1000 mL via INTRAVENOUS

## 2017-03-07 MED ORDER — MECLIZINE HCL 25 MG PO TABS
25.0000 mg | ORAL_TABLET | Freq: Once | ORAL | Status: AC
  Administered 2017-03-07: 25 mg via ORAL
  Filled 2017-03-07: qty 1

## 2017-03-07 NOTE — ED Notes (Signed)
Patient ambulatory to lobby with steady gait, NAD noted. Verbalized understanding of discharge instructions and followup care.  

## 2017-03-07 NOTE — ED Triage Notes (Signed)
Pt with dizziness that started on Tuesday when he woke up. Pt states that it seems to correspond with position change. Pt also states that his BP has been elevated, 163/102 in triage, and has had sinus issues for 2 weeks.

## 2017-03-07 NOTE — ED Provider Notes (Signed)
[facility] Emergency Department Provider Note   ____________________________________________    I have reviewed the triage vital signs and the nursing notes.   HISTORY  Chief Complaint Dizziness     HPI Chase Brooks is a 57 y.o. male who presents with complaints of dizziness.  Patient reports symptoms started on Tuesday morning.  He reports when he initially sat up from bed that morning he felt like the room was moving.  This resolved after holding still.  He stayed home from work that day and had similar symptoms in the morning on Wednesday did go to work.  Generally he felt well except if he turned his head in certain ways and he would feel slight disequilibrium.  No lightheadedness.  No palpitations.  No shortness of breath.  No recent travel.  No calf pain or swelling.  No pleurisy.  Has had a sinus infection over the last 2 weeks, pressure in his right maxillary sinus area.  Frequent popping of the right ear.  No ringing or tinnitus  History reviewed. No pertinent past medical history.  There are no active problems to display for this patient.   Past Surgical History:  Procedure Laterality Date  . COLONOSCOPY W/ POLYPECTOMY  03/26/2014   polypectomy x 2, repeat 5 years    Prior to Admission medications   Medication Sig Start Date End Date Taking? Authorizing Provider  aspirin EC 81 MG tablet Take 81 mg by mouth daily.   Yes [provider]  Cyanocobalamin (B-12 PO) Take 500 mcg by mouth daily.    Yes [provider]  amoxicillin-clavulanate (AUGMENTIN) 875-125 MG tablet Take 1 tablet by mouth 2 (two) times daily for 7 days. 03/07/17 03/14/17  Jene Every, MD  meclizine (ANTIVERT) 25 MG tablet Take 1 tablet (25 mg total) by mouth 3 (three) times daily as needed for dizziness. 03/07/17   Jene Every, MD     Allergies Patient has no known allergies.  Family History  Problem Relation Age of Onset  . Intracerebral  hemorrhage Mother   . Cancer Neg Hx   . COPD Neg Hx   . Diabetes Neg Hx   . Heart disease Neg Hx   . Hypertension Neg Hx   . Stroke Neg Hx     Social History Social History   Tobacco Use  . Smoking status: Never Smoker  . Smokeless tobacco: Never Used  Substance Use Topics  . Alcohol use: No  . Drug use: No    Review of Systems  Constitutional: Dizziness as above Eyes: No changes in vision.  ENT: No neck pain Cardiovascular: Denies chest pain.  Palpitations Respiratory: Denies shortness of breath. Gastrointestinal:   No nausea, no vomiting.   Genitourinary: Negative for dysuria. Musculoskeletal: Negative for myalgias Skin: Negative for rash. Neurological: Negative for headaches or weakness   ____________________________________________   PHYSICAL EXAM:  VITAL SIGNS: ED Triage Vitals  Enc Vitals Group     BP 03/07/17 0733 (!) 163/102     Pulse --      Resp --      Temp 03/07/17 0733 98.1 F (36.7 C)     Temp Source 03/07/17 0733 Oral     SpO2 03/07/17 0733 98 %     Weight 03/07/17 0734 87.1 kg (192 lb)     Height 03/07/17 0734 1.727 m (5\' 8" )     Head Circumference --      Peak Flow --      Pain  Score 03/07/17 0800 0     Pain Loc --      Pain Edu? --      Excl. in GC? --     Constitutional: Alert and oriented. No acute distress. Pleasant and interactive Eyes: Conjunctivae are normal.  Horizontal nystagmus, PERRLA Head: Atraumatic. Ears: Fullness right tympanic membrane suggesting fluid, left TM normal Nose: No congestion/rhinnorhea. Mouth/Throat: Mucous membranes are moist.   Neck:  Painless ROM Cardiovascular: Normal rate, regular rhythm. Grossly normal heart sounds.  Good peripheral circulation. Respiratory: Normal respiratory effort.  No retractions.  Gastrointestinal: Soft and nontender. No distention.  Genitourinary: deferred Musculoskeletal: No lower extremity tenderness nor edema.  Warm and well perfused Neurologic:  Normal speech and  language. No gross focal neurologic deficits are appreciated.  Cranial nerves II through XII are normal, normal strength in all extremities Skin:  Skin is warm, dry and intact. No rash noted. Psychiatric: Mood and affect are normal. Speech and behavior are normal.  ____________________________________________   LABS (all labs ordered are listed, but only abnormal results are displayed)  Labs Reviewed  CBC - Abnormal; Notable for the following components:      Result Value   WBC 13.6 (*)    All other components within normal limits  COMPREHENSIVE METABOLIC PANEL - Abnormal; Notable for the following components:   Glucose, Bld 112 (*)    Calcium 8.8 (*)    All other components within normal limits  TROPONIN I   ____________________________________________  EKG  ED ECG REPORT I, Jene Everyobert Deaunna Olarte, the attending physician, personally viewed and interpreted this ECG.  Date: 03/07/2017  Rhythm: normal sinus rhythm QRS Axis: normal Intervals: normal ST/T Wave abnormalities: normal Narrative Interpretation: no evidence of acute ischemia  ____________________________________________  RADIOLOGY  None ____________________________________________   PROCEDURES  Procedure(s) performed: No  Procedures   Critical Care performed: No ____________________________________________   INITIAL IMPRESSION / ASSESSMENT AND PLAN / ED COURSE  Pertinent labs & imaging results that were available during my care of the patient were reviewed by me and considered in my medical decision making (see chart for details).  Patient presents with symptoms most consistent with vertigo, appears to be worst in the morning but occasionally has symptoms throughout the day over the last 2 days.  No neuro deficits.  No headache.  No changes in vision.  No cardiac symptoms.  Fullness in the right TMs adjusting fluid and recent sinus infection suggestive of BPV  Treat with IV fluids, check labs, give  meclizine and reevaluate  Patient reassured by normal labs, felt better after fluids and meclizine.  Workup most consistent with vertigo, will treat likely sinus infection which may be causing symptoms.  If no improvement over the next 48-72 hours follow-up with ENT for further evaluation    ____________________________________________   FINAL CLINICAL IMPRESSION(S) / ED DIAGNOSES  Final diagnoses:  Vertigo  Acute maxillary sinusitis, recurrence not specified        Note:  This document was prepared using Dragon voice recognition software and may include unintentional dictation errors.    Jene EveryKinner, Jamae Tison, MD 03/07/17 1137

## 2017-04-22 ENCOUNTER — Ambulatory Visit: Payer: BC Managed Care – PPO | Admitting: Family Medicine

## 2017-04-22 ENCOUNTER — Encounter: Payer: Self-pay | Admitting: Family Medicine

## 2017-04-22 VITALS — BP 136/85 | HR 65 | Temp 98.8°F | Ht 69.0 in | Wt 204.5 lb

## 2017-04-22 DIAGNOSIS — R030 Elevated blood-pressure reading, without diagnosis of hypertension: Secondary | ICD-10-CM

## 2017-04-22 DIAGNOSIS — H9201 Otalgia, right ear: Secondary | ICD-10-CM | POA: Diagnosis not present

## 2017-04-22 NOTE — Progress Notes (Signed)
BP 136/85 (BP Location: Right Arm, Patient Position: Sitting, Cuff Size: Normal)   Pulse 65   Temp 98.8 F (37.1 C) (Oral)   Ht 5\' 9"  (1.753 m)   Wt 204 lb 8 oz (92.8 kg)   SpO2 96%   BMI 30.20 kg/m    Subjective:    Patient ID: Chase Brooks, male    DOB: 08/10/1960, [age] y.o.   MRN: [id number]  HPI: Chase Brooks is a 57 y.o. male  Chief Complaint  Patient presents with  . Hypertension    6 month follow up. Doing well. Patient been checking his blood pressure at home and has had normal readings  . Bilateral Ear Pain    Was in ER 03/07/17 for Vertigo and Sinusitis. Still having ear pain. No longer dizzy.   Pt here today for 6 month BP f/u as his BPs were high at CPE. Home BPs have been 130s/70s. Denies CP, SOB, HAs, visual changes. Has been working on [employer] diet and exercise.   Was in the ER about a month ago for dizziness, sinus infection probable vertigo. Sinus infection cleared up on amoxicillin but vertigo didn't clear up for 3 weeks.Went to ENT and was treated with maneuvers with good relief. Now having an ear ache after a boating trip this weekend but states the pain only occurred when the wind blew directly into his right ear. Once he put a hood over ears he was fine and no lingering sxs. Denies fevers, chills, rhinorrhea, dizziness, muffled hearing.   No past medical history on file.  Social History   Socioeconomic History  . Marital status: Married    Spouse name: Not on file  . Number of children: Not on file  . Years of education: Not on file  . Highest education level: Not on file  Occupational History  . Not on file  Social Needs  . Financial resource strain: Not on file  . Food insecurity:    Worry: Not on file    Inability: Not on file  . Transportation needs:    Medical: Not on file    Non-medical: Not on file  Tobacco Use  . Smoking status: Never Smoker  . Smokeless tobacco: Never Used  Substance and Sexual Activity  . Alcohol use: No  .  Drug use: No  . Sexual activity: Not on file  Lifestyle  . Physical activity:    Days per week: Not on file    Minutes per session: Not on file  . Stress: Not on file  Relationships  . Social connections:    Talks on phone: Not on file    Gets together: Not on file    Attends religious service: Not on file    Active member of club or organization: Not on file    Attends meetings of clubs or organizations: Not on file    Relationship status: Not on file  . Intimate partner violence:    Fear of current or ex partner: Not on file    Emotionally abused: Not on file    Physically abused: Not on file    Forced sexual activity: Not on file  Other Topics Concern  . Not on file  Social History Narrative  . Not on file   Relevant past medical, surgical, family and social history reviewed and updated as indicated. Interim medical history since our last visit reviewed. Allergies and medications reviewed and updated.  Review of Systems  Per HPI unless specifically indicated  above     Objective:    BP 136/85 (BP Location: Right Arm, Patient Position: Sitting, Cuff Size: Normal)   Pulse 65   Temp 98.8 F (37.1 C) (Oral)   Ht 5\' 9"  (1.753 m)   Wt 204 lb 8 oz (92.8 kg)   SpO2 96%   BMI 30.20 kg/m   Wt Readings from Last 3 Encounters:  04/22/17 204 lb 8 oz (92.8 kg)  03/07/17 192 lb (87.1 kg)  10/19/16 199 lb (90.3 kg)    Physical Exam  Constitutional: He is oriented to person, place, and time. He appears well-developed and well-nourished. No distress.  HENT:  Head: Atraumatic.  Right Ear: External ear normal.  Left Ear: External ear normal.  Nose: Nose normal.  Mouth/Throat: Oropharynx is clear and moist. No oropharyngeal exudate.  B/l TMs benign  Eyes: Pupils are equal, round, and reactive to light. Conjunctivae are normal.  Neck: Normal range of motion. Neck supple.  Cardiovascular: Normal rate and normal heart sounds.  Pulmonary/Chest: Effort normal and breath sounds  normal.  Musculoskeletal: Normal range of motion.  Neurological: He is alert and oriented to person, place, and time.  Skin: Skin is warm and dry.  Psychiatric: He has a normal mood and affect. His behavior is normal.  Nursing note and vitals reviewed.   Results for orders placed or performed during the hospital encounter of 03/07/17  CBC  Result Value Ref Range   WBC 13.6 (H) 3.8 - 10.6 K/uL   RBC 5.40 4.40 - 5.90 MIL/uL   Hemoglobin 17.2 13.0 - 18.0 g/dL   HCT 40.9 81.1 - 91.4 %   MCV 93.1 80.0 - 100.0 fL   MCH 31.8 26.0 - 34.0 pg   MCHC 34.2 32.0 - 36.0 g/dL   RDW 78.2 95.6 - 21.3 %   Platelets 223 150 - 440 K/uL  Comprehensive metabolic panel  Result Value Ref Range   Sodium 140 135 - 145 mmol/L   Potassium 3.9 3.5 - 5.1 mmol/L   Chloride 105 101 - 111 mmol/L   CO2 26 22 - 32 mmol/L   Glucose, Bld 112 (H) 65 - 99 mg/dL   BUN 16 6 - 20 mg/dL   Creatinine, Ser 0.86 0.61 - 1.24 mg/dL   Calcium 8.8 (L) 8.9 - 10.3 mg/dL   Total Protein 7.5 6.5 - 8.1 g/dL   Albumin 4.0 3.5 - 5.0 g/dL   AST 28 15 - 41 U/L   ALT 19 17 - 63 U/L   Alkaline Phosphatase 101 38 - 126 U/L   Total Bilirubin 0.8 0.3 - 1.2 mg/dL   GFR calc non Af Amer >60 >60 mL/min   GFR calc Af Amer >60 >60 mL/min   Anion gap 9 5 - 15  Troponin I  Result Value Ref Range   Troponin I <0.03 <0.03 ng/mL      Assessment & Plan:   Problem List Items Addressed This Visit    None    Visit Diagnoses    Elevated blood pressure, situational    -  Primary   BPs stable and WNL without medications. Continue home monitoring, DASH diet, increase exercise. F/u if home readings becoming persistently elevated   Right ear pain       Improved since recent boating trip, no lingering sxs. TMs benign. Continue to monitor, f/u with recurring sxs.        Follow up plan: Return in about 6 months (around 10/22/2017) for CPE after 10/5.

## 2017-04-22 NOTE — Patient Instructions (Signed)
Follow up for CPE 

## 2017-05-17 DIAGNOSIS — N200 Calculus of kidney: Secondary | ICD-10-CM | POA: Insufficient documentation

## 2017-06-06 ENCOUNTER — Encounter: Payer: Self-pay | Admitting: Family Medicine

## 2017-06-06 DIAGNOSIS — N200 Calculus of kidney: Secondary | ICD-10-CM

## 2017-06-11 ENCOUNTER — Telehealth: Payer: Self-pay | Admitting: Unknown Physician Specialty

## 2017-06-11 NOTE — Telephone Encounter (Signed)
Copied from CRM [id number]. Topic: Inquiry >> Jun 11, 2017  3:58 PM Yvonna Alanis wrote: Reason for CRM: Patient called requesting if Gabriel Cirri could work him in for an appt. Patient is having severe Kidney Stones. Patient states that he has been corresponding with Elnita Maxwell through [facility]. Please call patient at [phone].        Thank You!!!  >> Jun 11, 2017  4:44 PM Adela Ports M wrote: Please advise.  -----------------------------------------------------------------  Pt tells me he is having occasional pain.  We can se him next week as well

## 2017-06-18 ENCOUNTER — Ambulatory Visit (INDEPENDENT_AMBULATORY_CARE_PROVIDER_SITE_OTHER): Payer: BC Managed Care – PPO | Admitting: Unknown Physician Specialty

## 2017-06-18 ENCOUNTER — Encounter: Payer: Self-pay | Admitting: Unknown Physician Specialty

## 2017-06-18 VITALS — BP 143/87 | HR 62 | Temp 98.5°F | Ht 69.0 in | Wt 202.8 lb

## 2017-06-18 DIAGNOSIS — R311 Benign essential microscopic hematuria: Secondary | ICD-10-CM | POA: Diagnosis not present

## 2017-06-18 DIAGNOSIS — M545 Low back pain: Secondary | ICD-10-CM

## 2017-06-18 LAB — UA/M W/RFLX CULTURE, ROUTINE
BILIRUBIN UA: NEGATIVE
Glucose, UA: NEGATIVE
Ketones, UA: NEGATIVE
Leukocytes, UA: NEGATIVE
Nitrite, UA: NEGATIVE
PH UA: 5.5 (ref 5.0–7.5)
PROTEIN UA: NEGATIVE
Specific Gravity, UA: <1.005 — ABNORMAL LOW (ref 1.005–1.030)
UUROB: 0.2 mg/dL (ref 0.2–1.0)

## 2017-06-18 LAB — MICROSCOPIC EXAMINATION

## 2017-06-18 NOTE — Progress Notes (Signed)
BP (!) 143/87   Pulse 62   Temp 98.5 F (36.9 C) (Oral)   Ht 5\' 9"  (1.753 m)   Wt 202 lb 12.8 oz (92 kg)   SpO2 98%   BMI 29.95 kg/m    Subjective:    Patient ID: Chase Brooks, male    DOB: 06-06-1960, 57 y.o.   MRN: [id number]  HPI: Chase Brooks is a 57 y.o. male  Chief Complaint  Patient presents with  . Nephrolithiasis    pt states he thinks he may have a kidney stone on the left side, states he may have already passed it but he is not sure. States the pain comes and goes on the left side.    Pt states he was doing work around the house May 3.  States it got worse to a 9/10 and was SOB and in the bed.  States it eased off, rested over the night and felt better at a 3/10.  Was put on Tamulosin.  No more severe pain.  He had a short episode the other day.  States it feels like someone is pressing thumb left side.  Nothing seems to make the pain better less.    Relevant past medical, surgical, family and social history reviewed and updated as indicated. Interim medical history since our last visit reviewed. Allergies and medications reviewed and updated.  Review of Systems  Per HPI unless specifically indicated above     Objective:    BP (!) 143/87   Pulse 62   Temp 98.5 F (36.9 C) (Oral)   Ht 5\' 9"  (1.753 m)   Wt 202 lb 12.8 oz (92 kg)   SpO2 98%   BMI 29.95 kg/m   Wt Readings from Last 3 Encounters:  06/18/17 202 lb 12.8 oz (92 kg)  04/22/17 204 lb 8 oz (92.8 kg)  03/07/17 192 lb (87.1 kg)    Physical Exam  Constitutional: He is oriented to person, place, and time. He appears well-developed and well-nourished. No distress.  HENT:  Head: Normocephalic and atraumatic.  Eyes: Conjunctivae and lids are normal. Right eye exhibits no discharge. Left eye exhibits no discharge. No scleral icterus.  Neck: Normal range of motion. Neck supple. No JVD present. Carotid bruit is not present.  Cardiovascular: Normal rate, regular rhythm and normal heart sounds.    Pulmonary/Chest: Effort normal and breath sounds normal. No respiratory distress.  Abdominal: Normal appearance. There is no splenomegaly or hepatomegaly.  Musculoskeletal: Normal range of motion.  Neurological: He is alert and oriented to person, place, and time.  Skin: Skin is warm, dry and intact. No rash noted. No pallor.  Psychiatric: He has a normal mood and affect. His behavior is normal. Judgment and thought content normal.    Results for orders placed or performed during the hospital encounter of 03/07/17  CBC  Result Value Ref Range   WBC 13.6 (H) 3.8 - 10.6 K/uL   RBC 5.40 4.40 - 5.90 MIL/uL   Hemoglobin 17.2 13.0 - 18.0 g/dL   HCT 62.1 30.8 - 65.7 %   MCV 93.1 80.0 - 100.0 fL   MCH 31.8 26.0 - 34.0 pg   MCHC 34.2 32.0 - 36.0 g/dL   RDW 84.6 96.2 - 95.2 %   Platelets 223 150 - 440 K/uL  Comprehensive metabolic panel  Result Value Ref Range   Sodium 140 135 - 145 mmol/L   Potassium 3.9 3.5 - 5.1 mmol/L   Chloride 105 101 - 111  mmol/L   CO2 26 22 - 32 mmol/L   Glucose, Bld 112 (H) 65 - 99 mg/dL   BUN 16 6 - 20 mg/dL   Creatinine, Ser 4.090.99 0.61 - 1.24 mg/dL   Calcium 8.8 (L) 8.9 - 10.3 mg/dL   Total Protein 7.5 6.5 - 8.1 g/dL   Albumin 4.0 3.5 - 5.0 g/dL   AST 28 15 - 41 U/L   ALT 19 17 - 63 U/L   Alkaline Phosphatase 101 38 - 126 U/L   Total Bilirubin 0.8 0.3 - 1.2 mg/dL   GFR calc non Af Amer >60 >60 mL/min   GFR calc Af Amer >60 >60 mL/min   Anion gap 9 5 - 15  Troponin I  Result Value Ref Range   Troponin I <0.03 <0.03 ng/mL      Assessment & Plan:   Problem List Items Addressed This Visit    None    Visit Diagnoses    Left low back pain, unspecified chronicity, with sciatica presence unspecified    -  Primary   Probably due to stone.  Pending appt with Urology   Relevant Orders   UA/M w/rflx Culture, Routine   Benign essential microscopic hematuria       Probably due to stone.  Appt set up with Urology.  Appt not till next month.  In the meantime  drink lots of fluid and will keep pain meds from UC on hand   Relevant Orders   CK (Creatine Kinase)      Check CK due to report of earlier tea colored urine  Follow up plan: Return if symptoms worsen or fail to improve.

## 2017-06-19 LAB — CK: Total CK: 105 U/L (ref 24–204)

## 2017-06-24 ENCOUNTER — Encounter: Payer: Self-pay | Admitting: Unknown Physician Specialty

## 2017-08-06 ENCOUNTER — Ambulatory Visit
Admission: RE | Admit: 2017-08-06 | Discharge: 2017-08-06 | Disposition: A | Discharge status: Home / Self Care | Payer: BC Managed Care – PPO | Source: Ambulatory Visit | Attending: Urology | Admitting: Urology

## 2017-08-06 ENCOUNTER — Ambulatory Visit: Payer: BC Managed Care – PPO | Admitting: Urology

## 2017-08-06 ENCOUNTER — Encounter: Payer: Self-pay | Admitting: Urology

## 2017-08-06 ENCOUNTER — Other Ambulatory Visit: Payer: Self-pay

## 2017-08-06 VITALS — BP 163/103 | HR 84 | Ht 68.0 in | Wt 200.0 lb

## 2017-08-06 DIAGNOSIS — R93421 Abnormal radiologic findings on diagnostic imaging of right kidney: Secondary | ICD-10-CM | POA: Insufficient documentation

## 2017-08-06 DIAGNOSIS — N200 Calculus of kidney: Secondary | ICD-10-CM

## 2017-08-06 DIAGNOSIS — R109 Unspecified abdominal pain: Secondary | ICD-10-CM

## 2017-08-06 DIAGNOSIS — R3129 Other microscopic hematuria: Secondary | ICD-10-CM

## 2017-08-06 LAB — URINALYSIS, COMPLETE
BILIRUBIN UA: NEGATIVE
GLUCOSE, UA: NEGATIVE
Ketones, UA: NEGATIVE
Leukocytes, UA: NEGATIVE
Nitrite, UA: NEGATIVE
PROTEIN UA: NEGATIVE
RBC UA: NEGATIVE
SPEC GRAV UA: 1.02 (ref 1.005–1.030)
Urobilinogen, Ur: 0.2 mg/dL (ref 0.2–1.0)
pH, UA: 7.5 (ref 5.0–7.5)

## 2017-08-06 IMAGING — CR DG ABDOMEN 1V
1 series · 2 of 2 positions shown · non-contrast
Comparison: No prior.

CLINICAL DATA: Left-sided pain.  Possible nephrolithiasis.

EXAM:
ABDOMEN - 1 VIEW

[Series 1: dg abd 1 view · 0.14mm/px · 2 of 2 slices shown]
[im 1/2]
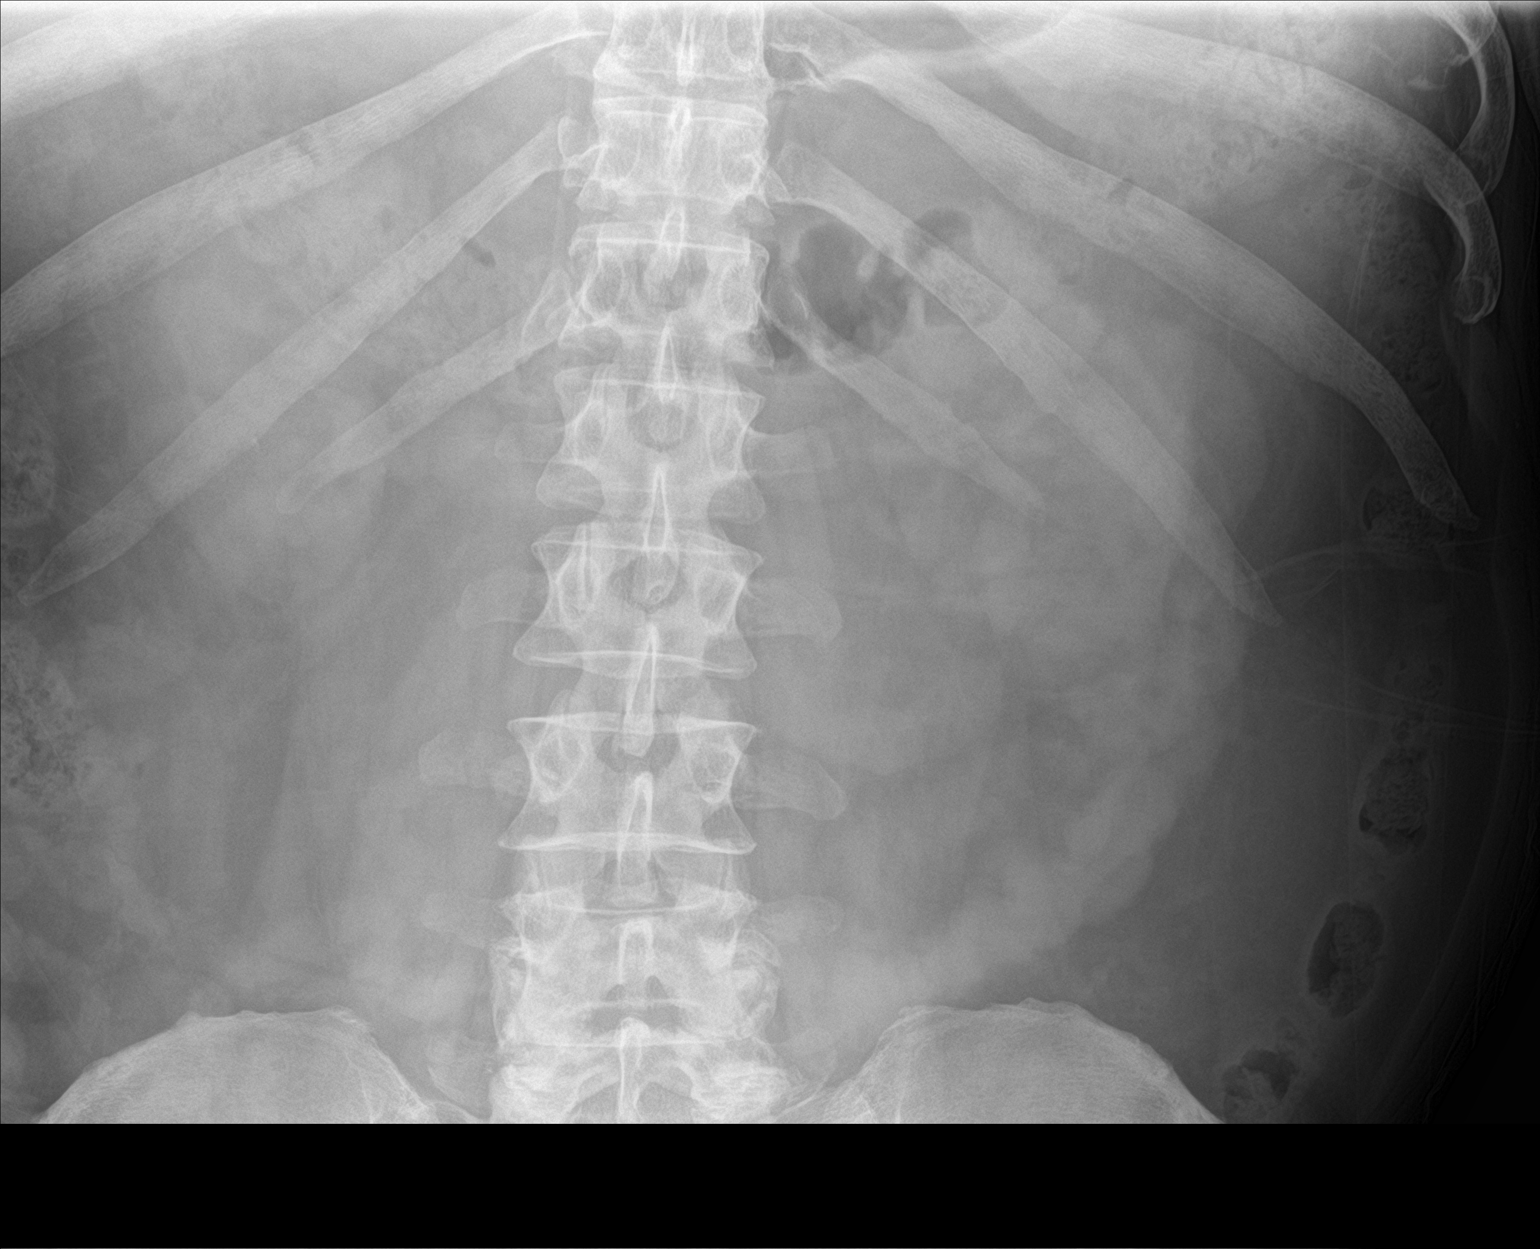
[im 2/2]
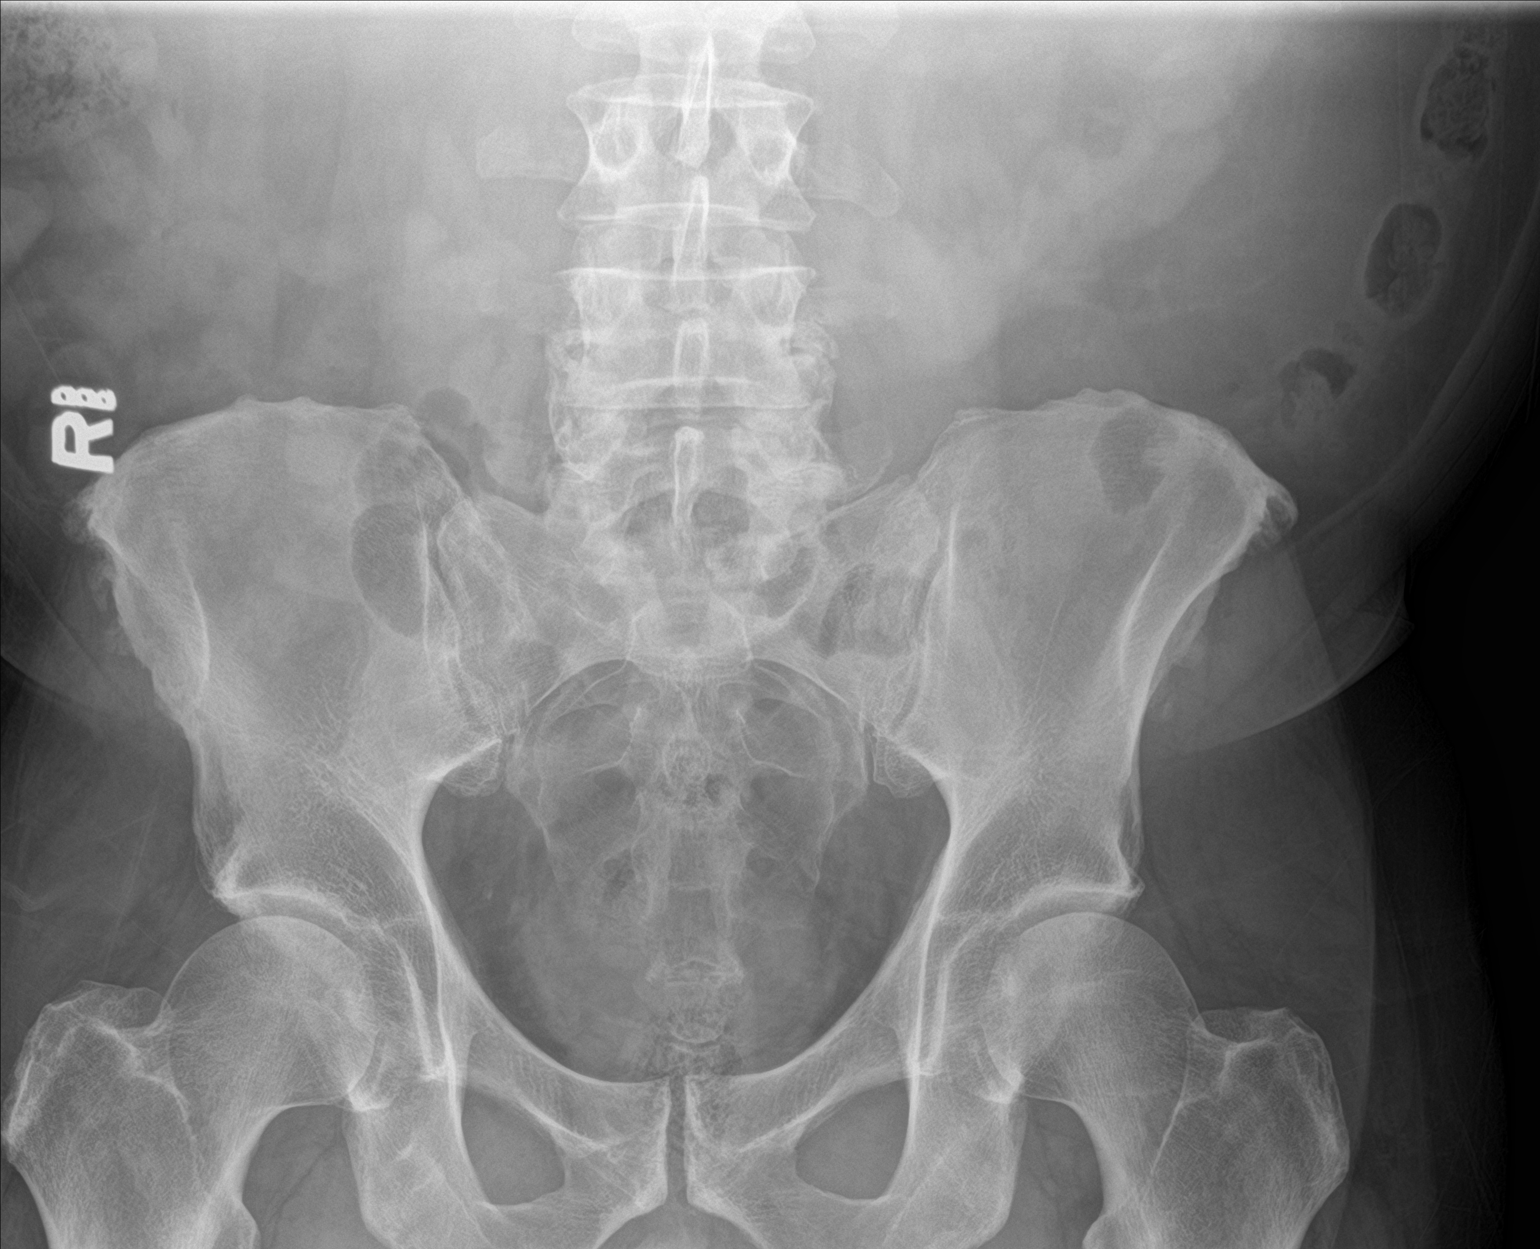

[2 of 2 positions shown; findings below may reference images not displayed]

FINDINGS: Soft tissue structures are unremarkable. Tiny punctate 2 mm calcific
density noted over the right lower renal pole suggesting
nephrolithiasis. Punctate calcific density over the right mid pelvis
most consistent. Distal right ureteral stone cannot be excluded. No
bowel distention or free air. No acute bony abnormality.
IMPRESSION: 1. Tiny punctate 2 mm calcific density noted over the right lower
renal pole suggesting nephrolithiasis.

2. Punctate calcific density noted the right mid pelvis most
consistent with phlebolith. Distal ureteral stone cannot be
excluded.

## 2017-08-06 NOTE — Progress Notes (Signed)
08/06/2017 8:55 AM   Chase SpringJoel Lee Brooks 10-08-1960 [id number]  Referring provider: Steele Sizerrissman, Mark A, MD [address] [place], [place] [id number]  Chief Complaint  Patient presents with  . Nephrolithiasis    New Patient    HPI: 57 year old male referred for further evaluation of possible kidney stones.  He was seen and evaluated on 05/18/2017 with severe left flank pain and microscopic hematuria possibly passing a kidney stone on the left.  No imaging was obtained at the time.    His pain slowly resoved other the next month with episodes of lessing severiy pain.  This lasted about 4 weeks.  Ultimately the pain migrated to the LLQ and penis.He followed up with his primary care physician and noted to have persistent microscopic blood (3-10 RBC/ HPF).  He was referred to urology for further evaluation.   He subsequenctly passed what he believes was the stone a few days alter.  His pain has fully resolved.  He is yet to have any imaging - he was sent from our clinic to get KUB and returned    No previous stone episodes.    He drinks primarily water.  He does not add extra salt.  He does work outside and gets dehydrated at times.    PMH: Past Medical History:  Diagnosis Date  . Nephrolithiasis   . Vertigo     Surgical History: Past Surgical History:  Procedure Laterality Date  . COLONOSCOPY W/ POLYPECTOMY  03/26/2014   polypectomy x 2, repeat 5 years    Home Medications:  Allergies as of 08/06/2017   No Known Allergies     Medication List        Accurate as of 08/06/17  8:55 AM. Always use your most recent med list.          aspirin EC 81 MG tablet Take 81 mg by mouth daily.   B-12 PO Take 500 mcg by mouth daily.       Allergies: No Known Allergies  Family History: Family History  Problem Relation Age of Onset  . Intracerebral hemorrhage Mother   . Cancer Neg Hx   . COPD Neg Hx   . Diabetes Neg Hx   . Heart disease Neg Hx   . Hypertension Neg Hx     . Stroke Neg Hx     Social History:  reports that he has never smoked. He has never used smokeless tobacco. He reports that he does not drink alcohol or use drugs.  ROS: UROLOGY Frequent Urination?: No Hard to postpone urination?: No Burning/pain with urination?: No Get up at night to urinate?: No Leakage of urine?: No Urine stream starts and stops?: No Trouble starting stream?: No Do you have to strain to urinate?: No Blood in urine?: No Urinary tract infection?: No Sexually transmitted disease?: No Injury to kidneys or bladder?: No Painful intercourse?: No Weak stream?: No Erection problems?: No Penile pain?: No  Gastrointestinal Nausea?: No Vomiting?: No Indigestion/heartburn?: No Diarrhea?: No Constipation?: No  Constitutional Fever: No Night sweats?: No Weight loss?: No Fatigue?: No  Skin Skin rash/lesions?: No Itching?: No  Eyes Blurred vision?: No Double vision?: No  Ears/Nose/Throat Sore throat?: No Sinus problems?: No  Hematologic/Lymphatic Swollen glands?: No Easy bruising?: Yes  Cardiovascular Leg swelling?: No Chest pain?: No  Respiratory Cough?: No Shortness of breath?: No  Endocrine Excessive thirst?: No  Musculoskeletal Back pain?: No Joint pain?: No  Neurological Headaches?: No Dizziness?: No  Psychologic Depression?: No Anxiety?: No  Physical Exam: BP (!) 163/103   Pulse 84   Ht 5\' 8"  (1.727 m)   Wt 200 lb (90.7 kg)   BMI 30.41 kg/m   Constitutional:  Alert and oriented, No acute distress. HEENT: [place] AT, moist mucus membranes.  Trachea midline, no masses. Cardiovascular: No clubbing, cyanosis, or edema. Respiratory: Normal respiratory effort, no increased work of breathing. GI: Abdomen is soft, nontender, nondistended, no abdominal masses GU: No CVA tenderness Skin: No rashes, bruises or suspicious lesions. Neurologic: Grossly intact, no focal deficits, moving all 4 extremities. Psychiatric: Normal mood and  affect.  Laboratory Data: Lab Results  Component Value Date   WBC 13.6 (H) 03/07/2017   HGB 17.2 03/07/2017   HCT 50.3 03/07/2017   MCV 93.1 03/07/2017   PLT 223 03/07/2017    Lab Results  Component Value Date   CREATININE 0.99 03/07/2017    Lab Results  Component Value Date   HGBA1C 5.7 (H) 10/14/2015    Urinalysis Results for orders placed or performed in visit on 08/06/17  Urinalysis, Complete  Result Value Ref Range   Specific Gravity, UA 1.020 1.005 - 1.030   pH, UA 7.5 5.0 - 7.5   Color, UA Yellow Yellow   Appearance Ur Clear Clear   Leukocytes, UA Negative Negative   Protein, UA Negative Negative/Trace   Glucose, UA Negative Negative   Ketones, UA Negative Negative   RBC, UA Negative Negative   Bilirubin, UA Negative Negative   Urobilinogen, Ur 0.2 0.2 - 1.0 mg/dL   Nitrite, UA Negative Negative     Pertinent Imaging: CLINICAL DATA:  Left-sided pain.  Possible nephrolithiasis.  EXAM: ABDOMEN - 1 VIEW  COMPARISON:  No prior.  FINDINGS: Soft tissue structures are unremarkable. Tiny punctate 2 mm calcific density noted over the right lower renal pole suggesting nephrolithiasis. Punctate calcific density over the right mid pelvis most consistent. Distal right ureteral stone cannot be excluded. No bowel distention or free air. No acute bony abnormality.  IMPRESSION: 1. Tiny punctate 2 mm calcific density noted over the right lower renal pole suggesting nephrolithiasis.  2. Punctate calcific density noted the right mid pelvis most consistent with phlebolith. Distal ureteral stone cannot be excluded.   Electronically Signed   By: Maisie Fus  Register   On: 08/06/2017 08:50  KUB personally reviewed, agree with above interpretation.  Assessment & Plan:    1. Microscopic hematuria Likely secondary to stone episode Microscopic hematuria has resolved today after resolution of his pain with presumed interval passage of his stone I have advised  him to have a repeat urinalysis within the next year at his primary care and to ensure that he has no persistent or recurrent microscopic blood - Urinalysis, Complete  2. Left flank pain Resolved KUB today shows no ureteral stones  3. Right nephrolithiasis Punctate stone in the right kidney on KUB, otherwise asymptomatic  We discussed general stone prevention techniques including drinking plenty water with goal of producing 2.5 L urine daily, increased citric acid intake, avoidance of high oxalate containing foods, and decreased salt intake.  Information about dietary recommendations given today.   Given the very small size of this right-sided stone, no indication for serial imaging and follow-up at this time  Return if he become symptomatic, prn  Vanna Scotland, MD  [facility] [address] [place], [place] [address] [phone]

## 2017-10-25 ENCOUNTER — Encounter: Payer: Self-pay | Admitting: Family Medicine

## 2017-10-25 ENCOUNTER — Ambulatory Visit (INDEPENDENT_AMBULATORY_CARE_PROVIDER_SITE_OTHER): Payer: BC Managed Care – PPO | Admitting: Family Medicine

## 2017-10-25 VITALS — BP 152/99 | HR 78 | Temp 98.0°F | Ht 68.0 in | Wt 203.1 lb

## 2017-10-25 DIAGNOSIS — Z0000 Encounter for general adult medical examination without abnormal findings: Secondary | ICD-10-CM

## 2017-10-25 DIAGNOSIS — I10 Essential (primary) hypertension: Secondary | ICD-10-CM | POA: Diagnosis not present

## 2017-10-25 LAB — MICROSCOPIC EXAMINATION
BACTERIA UA: NONE SEEN
WBC, UA: NONE SEEN /hpf (ref 0–5)

## 2017-10-25 LAB — UA/M W/RFLX CULTURE, ROUTINE
Bilirubin, UA: NEGATIVE
GLUCOSE, UA: NEGATIVE
Ketones, UA: NEGATIVE
Leukocytes, UA: NEGATIVE
NITRITE UA: NEGATIVE
PROTEIN UA: NEGATIVE
Specific Gravity, UA: 1.015 (ref 1.005–1.030)
Urobilinogen, Ur: 0.2 mg/dL (ref 0.2–1.0)
pH, UA: 7 (ref 5.0–7.5)

## 2017-10-25 NOTE — Assessment & Plan Note (Signed)
Does not wish to start medication, will continue working on lifestyle modifications and recheck next year. Does not want a 6 month f/u to recheck.

## 2017-10-25 NOTE — Patient Instructions (Signed)
Follow up in 1 year.

## 2017-10-25 NOTE — Progress Notes (Signed)
BP (!) 152/99 (BP Location: Left Arm, Cuff Size: Normal)   Pulse 78   Temp 98 F (36.7 C) (Oral)   Ht 5\' 8"  (1.727 m)   Wt 203 lb 1.6 oz (92.1 kg)   SpO2 97%   BMI 30.88 kg/m    Subjective:    Patient ID: Chase Brooks, male    DOB: 1960-12-27, 57 y.o.   MRN: [id number]  HPI: [name] is a 57 y.o. male presenting on 10/25/2017 for comprehensive medical examination. Current medical complaints include:none  Had an episode of vertigo recently which has now resolved.  Passed one kidney stone, and has one 2 mm stone left on the right. Some hematuria last weekend but that has since resolved.   Does not check BPs at home, hx of elevated BPs. Does not want to start any medications. Working on [employer] and weight loss. Denies CP, SOB, dizziness, HAs.   He currently lives with: Interim Problems from his last visit: no  Depression Screen done today and results listed below:  Depression screen [facility] 2/9 10/25/2017 10/19/2016 10/14/2015  Decreased Interest 0 0 0  Down, Depressed, Hopeless 0 0 0  PHQ - 2 Score 0 0 0  Altered sleeping 0 - -  Tired, decreased energy 0 - -  Change in appetite 0 - -  Feeling bad or failure about yourself  0 - -  Trouble concentrating 0 - -  Moving slowly or fidgety/restless 0 - -  Suicidal thoughts 0 - -  PHQ-9 Score 0 - -    The patient does not have a history of falls. I did not complete a risk assessment for falls. A plan of care for falls was not documented.   Past Medical History:  Past Medical History:  Diagnosis Date  . Nephrolithiasis   . Vertigo     Surgical History:  Past Surgical History:  Procedure Laterality Date  . COLONOSCOPY W/ POLYPECTOMY  03/26/2014   polypectomy x 2, repeat 5 years    Medications:  Current Outpatient Medications on File Prior to Visit  Medication Sig  . aspirin EC 81 MG tablet Take 81 mg by mouth daily.   No current facility-administered medications on file prior to visit.     Allergies:    No Known Allergies  Social History:  Social History   Socioeconomic History  . Marital status: Married    Spouse name: Not on file  . Number of children: Not on file  . Years of education: Not on file  . Highest education level: Not on file  Occupational History  . Not on file  Social Needs  . Financial resource strain: Not on file  . Food insecurity:    Worry: Not on file    Inability: Not on file  . Transportation needs:    Medical: Not on file    Non-medical: Not on file  Tobacco Use  . Smoking status: Never Smoker  . Smokeless tobacco: Never Used  Substance and Sexual Activity  . Alcohol use: No  . Drug use: No  . Sexual activity: Not on file  Lifestyle  . Physical activity:    Days per week: Not on file    Minutes per session: Not on file  . Stress: Not on file  Relationships  . Social connections:    Talks on phone: Not on file    Gets together: Not on file    Attends religious service: Not on file  Active member of club or organization: Not on file    Attends meetings of clubs or organizations: Not on file    Relationship status: Not on file  . Intimate partner violence:    Fear of current or ex partner: Not on file    Emotionally abused: Not on file    Physically abused: Not on file    Forced sexual activity: Not on file  Other Topics Concern  . Not on file  Social History Narrative  . Not on file   Social History   Tobacco Use  Smoking Status Never Smoker  Smokeless Tobacco Never Used   Social History   Substance and Sexual Activity  Alcohol Use No    Family History:  Family History  Problem Relation Age of Onset  . Intracerebral hemorrhage Mother   . Cancer Neg Hx   . COPD Neg Hx   . Diabetes Neg Hx   . Heart disease Neg Hx   . Hypertension Neg Hx   . Stroke Neg Hx     Past medical history, surgical history, medications, allergies, family history and social history reviewed with patient today and changes made to appropriate  areas of the chart.   Review of Systems - General ROS: negative Psychological ROS: negative Ophthalmic ROS: negative ENT ROS: negative Allergy and Immunology ROS: negative Hematological and Lymphatic ROS: negative Endocrine ROS: negative Respiratory ROS: no cough, shortness of breath, or wheezing Cardiovascular ROS: no chest pain or dyspnea on exertion Gastrointestinal ROS: no abdominal pain, change in bowel habits, or black or bloody stools Genito-Urinary ROS: no dysuria, trouble voiding, or hematuria Musculoskeletal ROS: negative Neurological ROS: no TIA or stroke symptoms Dermatological ROS: negative All other ROS negative except what is listed above and in the HPI.      Objective:    BP (!) 152/99 (BP Location: Left Arm, Cuff Size: Normal)   Pulse 78   Temp 98 F (36.7 C) (Oral)   Ht 5\' 8"  (1.727 m)   Wt 203 lb 1.6 oz (92.1 kg)   SpO2 97%   BMI 30.88 kg/m   Wt Readings from Last 3 Encounters:  10/25/17 203 lb 1.6 oz (92.1 kg)  08/06/17 200 lb (90.7 kg)  06/18/17 202 lb 12.8 oz (92 kg)    Physical Exam  Constitutional: He is oriented to person, place, and time. He appears well-developed and well-nourished. No distress.  HENT:  Head: Atraumatic.  Right Ear: External ear normal.  Left Ear: External ear normal.  Nose: Nose normal.  Mouth/Throat: Oropharynx is clear and moist.  Eyes: Pupils are equal, round, and reactive to light. Conjunctivae and EOM are normal. No scleral icterus.  Neck: Normal range of motion. Neck supple.  Cardiovascular: Normal rate, regular rhythm, normal heart sounds and intact distal pulses.  No murmur heard. Pulmonary/Chest: Effort normal and breath sounds normal. No respiratory distress.  Abdominal: Soft. Bowel sounds are normal. He exhibits no distension and no mass. There is no tenderness. There is no guarding.  Genitourinary: Rectum normal and prostate normal.  Musculoskeletal: Normal range of motion. He exhibits no edema or tenderness.   Neurological: He is alert and oriented to person, place, and time. He has normal reflexes.  Skin: Skin is warm and dry. No rash noted.  Psychiatric: He has a normal mood and affect. His behavior is normal.  Nursing note and vitals reviewed.   Results for orders placed or performed in visit on 08/06/17  Urinalysis, Complete  Result Value Ref Range  Specific Gravity, UA 1.020 1.005 - 1.030   pH, UA 7.5 5.0 - 7.5   Color, UA Yellow Yellow   Appearance Ur Clear Clear   Leukocytes, UA Negative Negative   Protein, UA Negative Negative/Trace   Glucose, UA Negative Negative   Ketones, UA Negative Negative   RBC, UA Negative Negative   Bilirubin, UA Negative Negative   Urobilinogen, Ur 0.2 0.2 - 1.0 mg/dL   Nitrite, UA Negative Negative      Assessment & Plan:   Problem List Items Addressed This Visit      Cardiovascular and Mediastinum   Essential hypertension    Does not wish to start medication, will continue working on lifestyle modifications and recheck next year. Does not want a 6 month f/u to recheck.        Other Visit Diagnoses    Annual physical exam    -  Primary   Relevant Orders   CBC with Differential/Platelet   Comprehensive metabolic panel   Lipid Panel w/o Chol/HDL Ratio   UA/M w/rflx Culture, Routine       Discussed aspirin prophylaxis for myocardial infarction prevention and decision was made to continue ASA  LABORATORY TESTING:  Health maintenance labs ordered today as discussed above.   The natural history of prostate cancer and ongoing controversy regarding screening and potential treatment outcomes of prostate cancer has been discussed with the patient. The meaning of a false positive PSA and a false negative PSA has been discussed. He indicates understanding of the limitations of this screening test and wishes not to proceed with screening PSA testing.   IMMUNIZATIONS:   - Tdap: Tetanus vaccination status reviewed: last tetanus booster within 10  years. - Influenza: Refused  SCREENING: - Colonoscopy: Up to date  Discussed with patient purpose of the colonoscopy is to detect colon cancer at curable precancerous or early stages   PATIENT COUNSELING:    Sexuality: Discussed sexually transmitted diseases, partner selection, use of condoms, avoidance of unintended pregnancy  and contraceptive alternatives.   Advised to avoid cigarette smoking.  I discussed with the patient that most people either abstain from alcohol or drink within safe limits (<=14/week and <=4 drinks/occasion for males, <=7/weeks and <= 3 drinks/occasion for females) and that the risk for alcohol disorders and other health effects rises proportionally with the number of drinks per week and how often a drinker exceeds daily limits.  Discussed cessation/primary prevention of drug use and availability of treatment for abuse.   Diet: Encouraged to adjust caloric intake to maintain  or achieve ideal body weight, to reduce intake of dietary saturated fat and total fat, to limit sodium intake by avoiding high sodium foods and not adding table salt, and to maintain adequate dietary potassium and calcium preferably from fresh fruits, vegetables, and low-fat dairy products.    stressed the importance of regular exercise  Injury prevention: Discussed safety belts, safety helmets, smoke detector, smoking near bedding or upholstery.   Dental health: Discussed importance of regular tooth brushing, flossing, and dental visits.   Follow up plan: NEXT PREVENTATIVE PHYSICAL DUE IN 1 YEAR. Return in about 1 year (around 10/26/2018) for CPE.

## 2017-10-26 LAB — LIPID PANEL W/O CHOL/HDL RATIO
Cholesterol, Total: 193 mg/dL (ref 100–199)
HDL: 42 mg/dL (ref >39)
LDL Calculated: 121 mg/dL — ABNORMAL HIGH (ref 0–99)
TRIGLYCERIDES: 148 mg/dL (ref 0–149)
VLDL Cholesterol Cal: 30 mg/dL (ref 5–40)

## 2017-10-26 LAB — CBC WITH DIFFERENTIAL/PLATELET
BASOS ABS: 0.1 10*3/uL (ref 0.0–0.2)
Basos: 1 %
EOS (ABSOLUTE): 0.2 10*3/uL (ref 0.0–0.4)
Eos: 2 %
Hematocrit: 48.7 % (ref 37.5–51.0)
Hemoglobin: 17.1 g/dL (ref 13.0–17.7)
IMMATURE GRANS (ABS): 0 10*3/uL (ref 0.0–0.1)
Immature Granulocytes: 0 %
LYMPHS: 26 %
Lymphocytes Absolute: 2 10*3/uL (ref 0.7–3.1)
MCH: 32.4 pg (ref 26.6–33.0)
MCHC: 35.1 g/dL (ref 31.5–35.7)
MCV: 92 fL (ref 79–97)
MONOS ABS: 0.7 10*3/uL (ref 0.1–0.9)
Monocytes: 9 %
NEUTROS ABS: 4.9 10*3/uL (ref 1.4–7.0)
Neutrophils: 62 %
PLATELETS: 220 10*3/uL (ref 150–450)
RBC: 5.27 x10E6/uL (ref 4.14–5.80)
RDW: 12.4 % (ref 12.3–15.4)
WBC: 7.8 10*3/uL (ref 3.4–10.8)

## 2017-10-26 LAB — COMPREHENSIVE METABOLIC PANEL
ALT: 17 IU/L (ref 0–44)
AST: 23 IU/L (ref 0–40)
Albumin/Globulin Ratio: 1.7 (ref 1.2–2.2)
Albumin: 4.5 g/dL (ref 3.5–5.5)
Alkaline Phosphatase: 98 IU/L (ref 39–117)
BILIRUBIN TOTAL: 0.5 mg/dL (ref 0.0–1.2)
BUN/Creatinine Ratio: 12 (ref 9–20)
BUN: 12 mg/dL (ref 6–24)
CO2: 25 mmol/L (ref 20–29)
Calcium: 9.7 mg/dL (ref 8.7–10.2)
Chloride: 97 mmol/L (ref 96–106)
Creatinine, Ser: 1.01 mg/dL (ref 0.76–1.27)
GFR calc Af Amer: 96 mL/min/{1.73_m2} (ref >59)
GFR calc non Af Amer: 83 mL/min/{1.73_m2} (ref >59)
GLUCOSE: 67 mg/dL (ref 65–99)
Globulin, Total: 2.7 g/dL (ref 1.5–4.5)
POTASSIUM: 4.6 mmol/L (ref 3.5–5.2)
Sodium: 140 mmol/L (ref 134–144)
TOTAL PROTEIN: 7.2 g/dL (ref 6.0–8.5)

## 2018-06-04 ENCOUNTER — Encounter: Payer: Self-pay | Admitting: Family Medicine

## 2018-10-31 ENCOUNTER — Encounter: Payer: Self-pay | Admitting: Family Medicine

## 2018-10-31 ENCOUNTER — Other Ambulatory Visit: Payer: Self-pay

## 2018-10-31 ENCOUNTER — Ambulatory Visit (INDEPENDENT_AMBULATORY_CARE_PROVIDER_SITE_OTHER): Payer: BC Managed Care – PPO | Admitting: Family Medicine

## 2018-10-31 VITALS — BP 130/90 | HR 90 | Temp 98.0°F | Ht 68.5 in | Wt 203.0 lb

## 2018-10-31 DIAGNOSIS — Z0000 Encounter for general adult medical examination without abnormal findings: Secondary | ICD-10-CM

## 2018-10-31 DIAGNOSIS — I10 Essential (primary) hypertension: Secondary | ICD-10-CM

## 2018-10-31 LAB — UA/M W/RFLX CULTURE, ROUTINE
Bilirubin, UA: NEGATIVE
Glucose, UA: NEGATIVE
Ketones, UA: NEGATIVE
Leukocytes,UA: NEGATIVE
Nitrite, UA: NEGATIVE
Protein,UA: NEGATIVE
RBC, UA: NEGATIVE
Specific Gravity, UA: 1.02 (ref 1.005–1.030)
Urobilinogen, Ur: 0.2 mg/dL (ref 0.2–1.0)
pH, UA: 8.5 — ABNORMAL HIGH (ref 5.0–7.5)

## 2018-10-31 NOTE — Assessment & Plan Note (Signed)
BPs stable and WNL, continue home monitoring, DASH diet, exercise

## 2018-10-31 NOTE — Progress Notes (Signed)
BP 130/90    Pulse 90    Temp 98 F (36.7 C) (Oral)    Ht 5' 8.5" (1.74 m)    Wt 203 lb (92.1 kg)    SpO2 98%    BMI 30.42 kg/m    Subjective:    Patient ID: Chase Brooks, male    DOB: 02-09-1960, 58 y.o.   MRN: [id number]  HPI: Chase Brooks is a 58 y.o. male presenting on 10/31/2018 for comprehensive medical examination. Current medical complaints include:none   BPs consistently 120-130s/80s when checked at home. Denies CP, SOB, HAs, dizziness.   He currently lives with: Interim Problems from his last visit: no  Depression Screen done today and results listed below:  Depression screen [facility] 2/9 10/31/2018 10/25/2017 10/19/2016 10/14/2015  Decreased Interest 0 0 0 0  Down, Depressed, Hopeless 0 0 0 0  PHQ - 2 Score 0 0 0 0  Altered sleeping 0 0 - -  Tired, decreased energy 0 0 - -  Change in appetite 0 0 - -  Feeling bad or failure about yourself  0 0 - -  Trouble concentrating 0 0 - -  Moving slowly or fidgety/restless 0 0 - -  Suicidal thoughts 0 0 - -  PHQ-9 Score 0 0 - -    The patient does not have a history of falls. I did complete a risk assessment for falls. A plan of care for falls was documented.   Past Medical History:  Past Medical History:  Diagnosis Date   Nephrolithiasis    Vertigo     Surgical History:  Past Surgical History:  Procedure Laterality Date   COLONOSCOPY W/ POLYPECTOMY  03/26/2014   polypectomy x 2, repeat 5 years    Medications:  Current Outpatient Medications on File Prior to Visit  Medication Sig   aspirin EC 81 MG tablet Take 81 mg by mouth daily.   Multiple Vitamins-Minerals (CENTRUM SILVER 50+MEN) TABS    No current facility-administered medications on file prior to visit.     Allergies:  No Known Allergies  Social History:  Social History   Socioeconomic History   Marital status: Married    Spouse name: Not on file   Number of children: Not on file   Years of education: Not on file   Highest  education level: Not on file  Occupational History   Not on file  Social Needs   Financial resource strain: Not on file   Food insecurity    Worry: Not on file    Inability: Not on file   Transportation needs    Medical: Not on file    Non-medical: Not on file  Tobacco Use   Smoking status: Never Smoker   Smokeless tobacco: Never Used  Substance and Sexual Activity   Alcohol use: No   Drug use: No   Sexual activity: Not on file  Lifestyle   Physical activity    Days per week: Not on file    Minutes per session: Not on file   Stress: Not on file  Relationships   Social connections    Talks on phone: Not on file    Gets together: Not on file    Attends religious service: Not on file    Active member of club or organization: Not on file    Attends meetings of clubs or organizations: Not on file    Relationship status: Not on file   Intimate partner violence    Fear  of current or ex partner: Not on file    Emotionally abused: Not on file    Physically abused: Not on file    Forced sexual activity: Not on file  Other Topics Concern   Not on file  Social History Narrative   Not on file   Social History   Tobacco Use  Smoking Status Never Smoker  Smokeless Tobacco Never Used   Social History   Substance and Sexual Activity  Alcohol Use No    Family History:  Family History  Problem Relation Age of Onset   Intracerebral hemorrhage Mother    Cancer Neg Hx    COPD Neg Hx    Diabetes Neg Hx    Heart disease Neg Hx    Hypertension Neg Hx    Stroke Neg Hx     Past medical history, surgical history, medications, allergies, family history and social history reviewed with patient today and changes made to appropriate areas of the chart.   Review of Systems - General ROS: negative Psychological ROS: negative Ophthalmic ROS: negative ENT ROS: negative Allergy and Immunology ROS: negative Hematological and Lymphatic ROS: negative Endocrine  ROS: negative Breast ROS: negative for breast lumps Respiratory ROS: no cough, shortness of breath, or wheezing Cardiovascular ROS: no chest pain or dyspnea on exertion Gastrointestinal ROS: no abdominal pain, change in bowel habits, or black or bloody stools Genito-Urinary ROS: no dysuria, trouble voiding, or hematuria Musculoskeletal ROS: negative Neurological ROS: no TIA or stroke symptoms Dermatological ROS: negative All other ROS negative except what is listed above and in the HPI.      Objective:    BP 130/90    Pulse 90    Temp 98 F (36.7 C) (Oral)    Ht 5' 8.5" (1.74 m)    Wt 203 lb (92.1 kg)    SpO2 98%    BMI 30.42 kg/m   Wt Readings from Last 3 Encounters:  10/31/18 203 lb (92.1 kg)  10/25/17 203 lb 1.6 oz (92.1 kg)  08/06/17 200 lb (90.7 kg)    Physical Exam Vitals signs and nursing note reviewed.  Constitutional:      General: He is not in acute distress.    Appearance: He is well-developed.  HENT:     Head: Atraumatic.     Right Ear: Tympanic membrane and external ear normal.     Left Ear: Tympanic membrane and external ear normal.     Nose: Nose normal.     Mouth/Throat:     Mouth: Mucous membranes are moist.     Pharynx: Oropharynx is clear.  Eyes:     General: No scleral icterus.    Conjunctiva/sclera: Conjunctivae normal.     Pupils: Pupils are equal, round, and reactive to light.  Neck:     Musculoskeletal: Normal range of motion and neck supple.  Cardiovascular:     Rate and Rhythm: Normal rate and regular rhythm.     Heart sounds: Normal heart sounds. No murmur.  Pulmonary:     Effort: Pulmonary effort is normal. No respiratory distress.     Breath sounds: Normal breath sounds.  Abdominal:     General: Bowel sounds are normal. There is no distension.     Palpations: Abdomen is soft. There is no mass.     Tenderness: There is no abdominal tenderness. There is no guarding.  Genitourinary:    Comments: GU exam declined Musculoskeletal: Normal  range of motion.        General:  No tenderness.  Skin:    General: Skin is warm and dry.     Findings: No rash.  Neurological:     General: No focal deficit present.     Mental Status: He is alert and oriented to person, place, and time.     Deep Tendon Reflexes: Reflexes are normal and symmetric.  Psychiatric:        Mood and Affect: Mood normal.        Behavior: Behavior normal.        Thought Content: Thought content normal.        Judgment: Judgment normal.     Results for orders placed or performed in visit on 10/25/17  Microscopic Examination   URINE  Result Value Ref Range   WBC, UA None seen 0 - 5 /hpf   RBC, UA 0-2 0 - 2 /hpf   Epithelial Cells (non renal) CANCELED    Bacteria, UA None seen None seen/Few  CBC with Differential/Platelet  Result Value Ref Range   WBC 7.8 3.4 - 10.8 x10E3/uL   RBC 5.27 4.14 - 5.80 x10E6/uL   Hemoglobin 17.1 13.0 - 17.7 g/dL   Hematocrit 01.0 27.2 - 51.0 %   MCV 92 79 - 97 fL   MCH 32.4 26.6 - 33.0 pg   MCHC 35.1 31.5 - 35.7 g/dL   RDW 53.6 64.4 - 03.4 %   Platelets 220 150 - 450 x10E3/uL   Neutrophils 62 Not Estab. %   Lymphs 26 Not Estab. %   Monocytes 9 Not Estab. %   Eos 2 Not Estab. %   Basos 1 Not Estab. %   Neutrophils Absolute 4.9 1.4 - 7.0 x10E3/uL   Lymphocytes Absolute 2.0 0.7 - 3.1 x10E3/uL   Monocytes Absolute 0.7 0.1 - 0.9 x10E3/uL   EOS (ABSOLUTE) 0.2 0.0 - 0.4 x10E3/uL   Basophils Absolute 0.1 0.0 - 0.2 x10E3/uL   Immature Granulocytes 0 Not Estab. %   Immature Grans (Abs) 0.0 0.0 - 0.1 x10E3/uL  Comprehensive metabolic panel  Result Value Ref Range   Glucose 67 65 - 99 mg/dL   BUN 12 6 - 24 mg/dL   Creatinine, Ser 7.42 0.76 - 1.27 mg/dL   GFR calc non Af Amer 83 >59 mL/min/1.73   GFR calc Af Amer 96 >59 mL/min/1.73   BUN/Creatinine Ratio 12 9 - 20   Sodium 140 134 - 144 mmol/L   Potassium 4.6 3.5 - 5.2 mmol/L   Chloride 97 96 - 106 mmol/L   CO2 25 20 - 29 mmol/L   Calcium 9.7 8.7 - 10.2 mg/dL   Total  Protein 7.2 6.0 - 8.5 g/dL   Albumin 4.5 3.5 - 5.5 g/dL   Globulin, Total 2.7 1.5 - 4.5 g/dL   Albumin/Globulin Ratio 1.7 1.2 - 2.2   Bilirubin Total 0.5 0.0 - 1.2 mg/dL   Alkaline Phosphatase 98 39 - 117 IU/L   AST 23 0 - 40 IU/L   ALT 17 0 - 44 IU/L  Lipid Panel w/o Chol/HDL Ratio  Result Value Ref Range   Cholesterol, Total 193 100 - 199 mg/dL   Triglycerides 595 0 - 149 mg/dL   HDL 42 >63 mg/dL   VLDL Cholesterol Cal 30 5 - 40 mg/dL   LDL Calculated 875 (H) 0 - 99 mg/dL  UA/M w/rflx Culture, Routine   Specimen: Urine   URINE  Result Value Ref Range   Specific Gravity, UA 1.015 1.005 - 1.030   pH, UA 7.0 5.0 -  7.5   Color, UA Yellow Yellow   Appearance Ur Clear Clear   Leukocytes, UA Negative Negative   Protein, UA Negative Negative/Trace   Glucose, UA Negative Negative   Ketones, UA Negative Negative   RBC, UA Trace (A) Negative   Bilirubin, UA Negative Negative   Urobilinogen, Ur 0.2 0.2 - 1.0 mg/dL   Nitrite, UA Negative Negative   Microscopic Examination See below:       Assessment & Plan:   Problem List Items Addressed This Visit      Cardiovascular and Mediastinum   Essential hypertension    BPs stable and WNL, continue home monitoring, DASH diet, exercise       Other Visit Diagnoses    Annual physical exam    -  Primary   Relevant Orders   CBC with Differential/Platelet   Comprehensive metabolic panel   Lipid Panel w/o Chol/HDL Ratio   TSH   UA/M w/rflx Culture, Routine       Discussed aspirin prophylaxis for myocardial infarction prevention and decision was made to continue ASA  LABORATORY TESTING:  Health maintenance labs ordered today as discussed above.   The natural history of prostate cancer and ongoing controversy regarding screening and potential treatment outcomes of prostate cancer has been discussed with the patient. The meaning of a false positive PSA and a false negative PSA has been discussed. He indicates understanding of the  limitations of this screening test and wishes not to proceed with screening PSA testing.   IMMUNIZATIONS:   - Tdap: Tetanus vaccination status reviewed: last tetanus booster within 10 years. - Influenza: Refused  SCREENING: - Colonoscopy: Up to date  Discussed with patient purpose of the colonoscopy is to detect colon cancer at curable precancerous or early stages   PATIENT COUNSELING:    Sexuality: Discussed sexually transmitted diseases, partner selection, use of condoms, avoidance of unintended pregnancy  and contraceptive alternatives.   Advised to avoid cigarette smoking.  I discussed with the patient that most people either abstain from alcohol or drink within safe limits (<=14/week and <=4 drinks/occasion for males, <=7/weeks and <= 3 drinks/occasion for females) and that the risk for alcohol disorders and other health effects rises proportionally with the number of drinks per week and how often a drinker exceeds daily limits.  Discussed cessation/primary prevention of drug use and availability of treatment for abuse.   Diet: Encouraged to adjust caloric intake to maintain  or achieve ideal body weight, to reduce intake of dietary saturated fat and total fat, to limit sodium intake by avoiding high sodium foods and not adding table salt, and to maintain adequate dietary potassium and calcium preferably from fresh fruits, vegetables, and low-fat dairy products.    stressed the importance of regular exercise  Injury prevention: Discussed safety belts, safety helmets, smoke detector, smoking near bedding or upholstery.   Dental health: Discussed importance of regular tooth brushing, flossing, and dental visits.   Follow up plan: NEXT PREVENTATIVE PHYSICAL DUE IN 1 YEAR. Return in about 1 year (around 10/31/2019) for CPE.

## 2018-11-01 LAB — COMPREHENSIVE METABOLIC PANEL
ALT: 17 IU/L (ref 0–44)
AST: 22 IU/L (ref 0–40)
Albumin/Globulin Ratio: 1.6 (ref 1.2–2.2)
Albumin: 4.5 g/dL (ref 3.8–4.9)
Alkaline Phosphatase: 100 IU/L (ref 39–117)
BUN/Creatinine Ratio: 13 (ref 9–20)
BUN: 13 mg/dL (ref 6–24)
Bilirubin Total: 0.4 mg/dL (ref 0.0–1.2)
CO2: 25 mmol/L (ref 20–29)
Calcium: 9.8 mg/dL (ref 8.7–10.2)
Chloride: 100 mmol/L (ref 96–106)
Creatinine, Ser: 0.99 mg/dL (ref 0.76–1.27)
GFR calc Af Amer: 98 mL/min/{1.73_m2} (ref >59)
GFR calc non Af Amer: 85 mL/min/{1.73_m2} (ref >59)
Globulin, Total: 2.9 g/dL (ref 1.5–4.5)
Glucose: 79 mg/dL (ref 65–99)
Potassium: 4.9 mmol/L (ref 3.5–5.2)
Sodium: 140 mmol/L (ref 134–144)
Total Protein: 7.4 g/dL (ref 6.0–8.5)

## 2018-11-01 LAB — CBC WITH DIFFERENTIAL/PLATELET
Basophils Absolute: 0 10*3/uL (ref 0.0–0.2)
Basos: 0 %
EOS (ABSOLUTE): 0.1 10*3/uL (ref 0.0–0.4)
Eos: 2 %
Hematocrit: 48.8 % (ref 37.5–51.0)
Hemoglobin: 17 g/dL (ref 13.0–17.7)
Immature Grans (Abs): 0 10*3/uL (ref 0.0–0.1)
Immature Granulocytes: 0 %
Lymphocytes Absolute: 1.8 10*3/uL (ref 0.7–3.1)
Lymphs: 25 %
MCH: 32 pg (ref 26.6–33.0)
MCHC: 34.8 g/dL (ref 31.5–35.7)
MCV: 92 fL (ref 79–97)
Monocytes Absolute: 0.6 10*3/uL (ref 0.1–0.9)
Monocytes: 9 %
Neutrophils Absolute: 4.5 10*3/uL (ref 1.4–7.0)
Neutrophils: 64 %
Platelets: 233 10*3/uL (ref 150–450)
RBC: 5.32 x10E6/uL (ref 4.14–5.80)
RDW: 12.4 % (ref 11.6–15.4)
WBC: 7.1 10*3/uL (ref 3.4–10.8)

## 2018-11-01 LAB — LIPID PANEL W/O CHOL/HDL RATIO
Cholesterol, Total: 190 mg/dL (ref 100–199)
HDL: 40 mg/dL (ref >39)
LDL Chol Calc (NIH): 128 mg/dL — ABNORMAL HIGH (ref 0–99)
Triglycerides: 122 mg/dL (ref 0–149)
VLDL Cholesterol Cal: 22 mg/dL (ref 5–40)

## 2018-11-01 LAB — TSH: TSH: 2.58 u[IU]/mL (ref 0.450–4.500)

## 2019-08-03 ENCOUNTER — Encounter: Payer: Self-pay | Admitting: Family Medicine

## 2019-11-06 ENCOUNTER — Other Ambulatory Visit: Payer: Self-pay

## 2019-11-06 ENCOUNTER — Encounter: Payer: Self-pay | Admitting: Family Medicine

## 2019-11-06 ENCOUNTER — Ambulatory Visit (INDEPENDENT_AMBULATORY_CARE_PROVIDER_SITE_OTHER): Payer: BC Managed Care – PPO | Admitting: Family Medicine

## 2019-11-06 ENCOUNTER — Encounter: Payer: BC Managed Care – PPO | Admitting: Family Medicine

## 2019-11-06 VITALS — BP 136/88 | HR 80 | Temp 98.6°F | Ht 68.5 in | Wt 200.0 lb

## 2019-11-06 DIAGNOSIS — Z1211 Encounter for screening for malignant neoplasm of colon: Secondary | ICD-10-CM | POA: Diagnosis not present

## 2019-11-06 DIAGNOSIS — Z0000 Encounter for general adult medical examination without abnormal findings: Secondary | ICD-10-CM

## 2019-11-06 LAB — URINALYSIS, ROUTINE W REFLEX MICROSCOPIC
Bilirubin, UA: NEGATIVE
Glucose, UA: NEGATIVE
Ketones, UA: NEGATIVE
Leukocytes,UA: NEGATIVE
Nitrite, UA: NEGATIVE
Protein,UA: NEGATIVE
RBC, UA: NEGATIVE
Specific Gravity, UA: 1.01 (ref 1.005–1.030)
Urobilinogen, Ur: 0.2 mg/dL (ref 0.2–1.0)
pH, UA: 5.5 (ref 5.0–7.5)

## 2019-11-06 NOTE — Patient Instructions (Signed)

## 2019-11-06 NOTE — Progress Notes (Signed)
BP 136/88 (BP Location: Right Arm, Cuff Size: Normal)   Pulse 80   Temp 98.6 F (37 C) (Oral)   Ht 5' 8.5" (1.74 m)   Wt 200 lb (90.7 kg)   SpO2 98%   BMI 29.97 kg/m    Subjective:    Patient ID: Chase Brooks, male    DOB: May 21, 1960, 59 y.o.   MRN: [id number]  HPI: Chase Brooks is a 59 y.o. male presenting on 11/06/2019 for comprehensive medical examination. Current medical complaints include:none  Interim Problems from his last visit: no  Depression Screen done today and results listed below:  [facility] 2/9 11/06/2019 10/31/2018 10/25/2017 10/19/2016 10/14/2015  Decreased Interest 0 0 0 0 0  Down, Depressed, Hopeless 0 0 0 0 0  PHQ - 2 Score 0 0 0 0 0  Altered sleeping 0 0 0 - -  Tired, decreased energy 0 0 0 - -  Change in appetite 0 0 0 - -  Feeling bad or failure about yourself  0 0 0 - -  Trouble concentrating 0 0 0 - -  Moving slowly or fidgety/restless 0 0 0 - -  Suicidal thoughts 0 0 0 - -  PHQ-9 Score 0 0 0 - -  Difficult doing work/chores Not difficult at all - - - -    Past Medical History:  Past Medical History:  Diagnosis Date  . Nephrolithiasis   . Vertigo     Surgical History:  Past Surgical History:  Procedure Laterality Date  . COLONOSCOPY W/ POLYPECTOMY  03/26/2014   polypectomy x 2, repeat 5 years    Medications:  Current Outpatient Medications on File Prior to Visit  Medication Sig  . aspirin EC 81 MG tablet Take 81 mg by mouth daily.  . Multiple Vitamins-Minerals (CENTRUM SILVER 50+MEN) TABS   . Omega-3 Fatty Acids (FISH OIL) 1000 MG CAPS Take by mouth.   No current facility-administered medications on file prior to visit.    Allergies:  No Known Allergies  Social History:  Social History   Socioeconomic History  . Marital status: Married    Spouse name: Not on file  . Number of children: Not on file  . Years of education: Not on file  . Highest education level: Not on file  Occupational History  . Not  on file  Tobacco Use  . Smoking status: Never Smoker  . Smokeless tobacco: Never Used  Vaping Use  . Vaping Use: Never used  Substance and Sexual Activity  . Alcohol use: No  . Drug use: No  . Sexual activity: Not on file  Other Topics Concern  . Not on file  Social History Narrative  . Not on file   Social Determinants of Health   Financial Resource Strain:   . Difficulty of Paying Living Expenses: Not on file  Food Insecurity:   . Worried About Programme researcher, broadcasting/film/videounning Out of Food in the Last Year: Not on file  . Ran Out of Food in the Last Year: Not on file  Transportation Needs:   . Lack of Transportation (Medical): Not on file  . Lack of Transportation (Non-Medical): Not on file  Physical Activity:   . Days of Exercise per Week: Not on file  . Minutes of Exercise per Session: Not on file  Stress:   . Feeling of Stress : Not on file  Social Connections:   . Frequency of Communication with Friends and Family: Not on file  . Frequency of  Social Gatherings with Friends and Family: Not on file  . Attends Religious Services: Not on file  . Active Member of Clubs or Organizations: Not on file  . Attends Banker Meetings: Not on file  . Marital Status: Not on file  Intimate Partner Violence:   . Fear of Current or Ex-Partner: Not on file  . Emotionally Abused: Not on file  . Physically Abused: Not on file  . Sexually Abused: Not on file   Social History   Tobacco Use  Smoking Status Never Smoker  Smokeless Tobacco Never Used   Social History   Substance and Sexual Activity  Alcohol Use No    Family History:  Family History  Problem Relation Age of Onset  . Intracerebral hemorrhage Mother   . Cancer Neg Hx   . COPD Neg Hx   . Diabetes Neg Hx   . Heart disease Neg Hx   . Hypertension Neg Hx   . Stroke Neg Hx     Past medical history, surgical history, medications, allergies, family history and social history reviewed with patient today and changes made to  appropriate areas of the chart.   Review of Systems  Constitutional: Negative.   HENT: Negative.   Eyes: Negative.   Respiratory: Negative.   Cardiovascular: Negative.   Gastrointestinal: Negative.   Genitourinary: Negative.   Musculoskeletal: Negative.   Skin: Negative.   Neurological: Negative.   Endo/Heme/Allergies: Negative for environmental allergies and polydipsia. Bruises/bleeds easily.  Psychiatric/Behavioral: Negative.     All other ROS negative except what is listed above and in the HPI.      Objective:    BP 136/88 (BP Location: Right Arm, Cuff Size: Normal)   Pulse 80   Temp 98.6 F (37 C) (Oral)   Ht 5' 8.5" (1.74 m)   Wt 200 lb (90.7 kg)   SpO2 98%   BMI 29.97 kg/m   Wt Readings from Last 3 Encounters:  11/06/19 200 lb (90.7 kg)  10/31/18 203 lb (92.1 kg)  10/25/17 203 lb 1.6 oz (92.1 kg)    Physical Exam  Results for orders placed or performed in visit on 10/31/18  CBC with Differential/Platelet  Result Value Ref Range   WBC 7.1 3.4 - 10.8 x10E3/uL   RBC 5.32 4.14 - 5.80 x10E6/uL   Hemoglobin 17.0 13.0 - 17.7 g/dL   Hematocrit 62.9 52.8 - 51.0 %   MCV 92 79 - 97 fL   MCH 32.0 26.6 - 33.0 pg   MCHC 34.8 31 - 35 g/dL   RDW 41.3 24.4 - 01.0 %   Platelets 233 150 - 450 x10E3/uL   Neutrophils 64 Not Estab. %   Lymphs 25 Not Estab. %   Monocytes 9 Not Estab. %   Eos 2 Not Estab. %   Basos 0 Not Estab. %   Neutrophils Absolute 4.5 1.40 - 7.00 x10E3/uL   Lymphocytes Absolute 1.8 0 - 3 x10E3/uL   Monocytes Absolute 0.6 0 - 0 x10E3/uL   EOS (ABSOLUTE) 0.1 0.0 - 0.4 x10E3/uL   Basophils Absolute 0.0 0 - 0 x10E3/uL   Immature Granulocytes 0 Not Estab. %   Immature Grans (Abs) 0.0 0.0 - 0.1 x10E3/uL  Comprehensive metabolic panel  Result Value Ref Range   Glucose 79 65 - 99 mg/dL   BUN 13 6 - 24 mg/dL   Creatinine, Ser 2.72 0.76 - 1.27 mg/dL   GFR calc non Af Amer 85 >59 mL/min/1.73   GFR calc Af Denyse Dago  98 >59 mL/min/1.73   BUN/Creatinine Ratio 13  9 - 20   Sodium 140 134 - 144 mmol/L   Potassium 4.9 3.5 - 5.2 mmol/L   Chloride 100 96 - 106 mmol/L   CO2 25 20 - 29 mmol/L   Calcium 9.8 8.7 - 10.2 mg/dL   Total Protein 7.4 6.0 - 8.5 g/dL   Albumin 4.5 3.8 - 4.9 g/dL   Globulin, Total 2.9 1.5 - 4.5 g/dL   Albumin/Globulin Ratio 1.6 1.2 - 2.2   Bilirubin Total 0.4 0.0 - 1.2 mg/dL   Alkaline Phosphatase 100 39 - 117 IU/L   AST 22 0 - 40 IU/L   ALT 17 0 - 44 IU/L  Lipid Panel w/o Chol/HDL Ratio  Result Value Ref Range   Cholesterol, Total 190 100 - 199 mg/dL   Triglycerides 182 0 - 149 mg/dL   HDL 40 >99 mg/dL   VLDL Cholesterol Cal 22 5 - 40 mg/dL   LDL Chol Calc (NIH) 371 (H) 0 - 99 mg/dL  TSH  Result Value Ref Range   TSH 2.580 0.450 - 4.500 uIU/mL  UA/M w/rflx Culture, Routine   Specimen: Urine   URINE  Result Value Ref Range   Specific Gravity, UA 1.020 1.005 - 1.030   pH, UA 8.5 (H) 5.0 - 7.5   Color, UA Yellow Yellow   Appearance Ur Clear Clear   Leukocytes,UA Negative Negative   Protein,UA Negative Negative/Trace   Glucose, UA Negative Negative   Ketones, UA Negative Negative   RBC, UA Negative Negative   Bilirubin, UA Negative Negative   Urobilinogen, Ur 0.2 0.2 - 1.0 mg/dL   Nitrite, UA Negative Negative      Assessment & Plan:   Problem List Items Addressed This Visit    None    Visit Diagnoses    Routine general medical examination at a health care facility    -  Primary   Vaccines up to date/declined. Screening labs checked today. Colonoscopy ordered. Continue diet and exercise. Call with any concerns.    Relevant Orders   CBC with Differential/Platelet   Comprehensive metabolic panel   Lipid Panel w/o Chol/HDL Ratio   PSA   TSH   Urinalysis, Routine w reflex microscopic   Screening for colon cancer       Referral to GI placed today.   Relevant Orders   Ambulatory referral to Gastroenterology       Discussed aspirin prophylaxis for myocardial infarction prevention and decision was made to  continue ASA  LABORATORY TESTING:  Health maintenance labs ordered today as discussed above.   The natural history of prostate cancer and ongoing controversy regarding screening and potential treatment outcomes of prostate cancer has been discussed with the patient. The meaning of a false positive PSA and a false negative PSA has been discussed. He indicates understanding of the limitations of this screening test and wishes to proceed with screening PSA testing.   IMMUNIZATIONS:   - Tdap: Tetanus vaccination status reviewed: last tetanus booster within 10 years. - Influenza: Refused - Pneumovax: Not applicable - Prevnar: Not applicable - COVID: Up to date  SCREENING: - Colonoscopy: Ordered today  Discussed with patient purpose of the colonoscopy is to detect colon cancer at curable precancerous or early stages   PATIENT COUNSELING:    Sexuality: Discussed sexually transmitted diseases, partner selection, use of condoms, avoidance of unintended pregnancy  and contraceptive alternatives.   Advised to avoid cigarette smoking.  I discussed with the patient  that most people either abstain from alcohol or drink within safe limits (<=14/week and <=4 drinks/occasion for males, <=7/weeks and <= 3 drinks/occasion for females) and that the risk for alcohol disorders and other health effects rises proportionally with the number of drinks per week and how often a drinker exceeds daily limits.  Discussed cessation/primary prevention of drug use and availability of treatment for abuse.   Diet: Encouraged to adjust caloric intake to maintain  or achieve ideal body weight, to reduce intake of dietary saturated fat and total fat, to limit sodium intake by avoiding high sodium foods and not adding table salt, and to maintain adequate dietary potassium and calcium preferably from fresh fruits, vegetables, and low-fat dairy products.    stressed the importance of regular exercise  Injury prevention:  Discussed safety belts, safety helmets, smoke detector, smoking near bedding or upholstery.   Dental health: Discussed importance of regular tooth brushing, flossing, and dental visits.   Follow up plan: NEXT PREVENTATIVE PHYSICAL DUE IN 1 YEAR. Return in about 1 year (around 11/05/2020) for physical.

## 2019-11-07 LAB — COMPREHENSIVE METABOLIC PANEL
ALT: 14 IU/L (ref 0–44)
AST: 21 IU/L (ref 0–40)
Albumin/Globulin Ratio: 1.6 (ref 1.2–2.2)
Albumin: 4.5 g/dL (ref 3.8–4.9)
Alkaline Phosphatase: 126 IU/L — ABNORMAL HIGH (ref 44–121)
BUN/Creatinine Ratio: 12 (ref 9–20)
BUN: 11 mg/dL (ref 6–24)
Bilirubin Total: 0.3 mg/dL (ref 0.0–1.2)
CO2: 26 mmol/L (ref 20–29)
Calcium: 9.2 mg/dL (ref 8.7–10.2)
Chloride: 104 mmol/L (ref 96–106)
Creatinine, Ser: 0.92 mg/dL (ref 0.76–1.27)
GFR calc Af Amer: 106 mL/min/{1.73_m2} (ref >59)
GFR calc non Af Amer: 92 mL/min/{1.73_m2} (ref >59)
Globulin, Total: 2.9 g/dL (ref 1.5–4.5)
Glucose: 81 mg/dL (ref 65–99)
Potassium: 5.1 mmol/L (ref 3.5–5.2)
Sodium: 144 mmol/L (ref 134–144)
Total Protein: 7.4 g/dL (ref 6.0–8.5)

## 2019-11-07 LAB — CBC WITH DIFFERENTIAL/PLATELET
Basophils Absolute: 0.1 10*3/uL (ref 0.0–0.2)
Basos: 1 %
EOS (ABSOLUTE): 0.2 10*3/uL (ref 0.0–0.4)
Eos: 3 %
Hematocrit: 50.8 % (ref 37.5–51.0)
Hemoglobin: 17.3 g/dL (ref 13.0–17.7)
Immature Grans (Abs): 0 10*3/uL (ref 0.0–0.1)
Immature Granulocytes: 0 %
Lymphocytes Absolute: 2 10*3/uL (ref 0.7–3.1)
Lymphs: 26 %
MCH: 32 pg (ref 26.6–33.0)
MCHC: 34.1 g/dL (ref 31.5–35.7)
MCV: 94 fL (ref 79–97)
Monocytes Absolute: 0.6 10*3/uL (ref 0.1–0.9)
Monocytes: 8 %
Neutrophils Absolute: 4.8 10*3/uL (ref 1.4–7.0)
Neutrophils: 62 %
Platelets: 222 10*3/uL (ref 150–450)
RBC: 5.41 x10E6/uL (ref 4.14–5.80)
RDW: 12.3 % (ref 11.6–15.4)
WBC: 7.7 10*3/uL (ref 3.4–10.8)

## 2019-11-07 LAB — LIPID PANEL W/O CHOL/HDL RATIO
Cholesterol, Total: 185 mg/dL (ref 100–199)
HDL: 44 mg/dL (ref >39)
LDL Chol Calc (NIH): 125 mg/dL — ABNORMAL HIGH (ref 0–99)
Triglycerides: 85 mg/dL (ref 0–149)
VLDL Cholesterol Cal: 16 mg/dL (ref 5–40)

## 2019-11-07 LAB — PSA: Prostate Specific Ag, Serum: 0.6 ng/mL (ref 0.0–4.0)

## 2019-11-07 LAB — TSH: TSH: 2.49 u[IU]/mL (ref 0.450–4.500)

## 2019-11-17 ENCOUNTER — Other Ambulatory Visit: Payer: Self-pay

## 2019-11-17 ENCOUNTER — Telehealth (INDEPENDENT_AMBULATORY_CARE_PROVIDER_SITE_OTHER): Payer: Self-pay | Admitting: Gastroenterology

## 2019-11-17 DIAGNOSIS — Z86010 Personal history of colonic polyps: Secondary | ICD-10-CM

## 2019-11-17 MED ORDER — PEG 3350-KCL-NA BICARB-NACL 420 G PO SOLR: 4000.0000 mL | Freq: Once | ORAL | 0 refills | Status: AC

## 2019-11-17 NOTE — Progress Notes (Signed)
Gastroenterology Pre-Procedure Review  Request Date: Monday 12/21/19 Requesting Physician: Dr. Servando Snare  PATIENT REVIEW QUESTIONS: The patient responded to the following health history questions as indicated:    1. Are you having any GI issues? no 2. Do you have a personal history of Polyps? yes (2016 colonoscopy performed by Dr. Servando Snare noted Colon Polyps) 3. Do you have a family history of Colon Cancer or Polyps? no 4. Diabetes Mellitus? no 5. Joint replacements in the past 12 months?no 6. Major health problems in the past 3 months?no 7. Any artificial heart valves, MVP, or defibrillator?no    MEDICATIONS & ALLERGIES:    Patient reports the following regarding taking any anticoagulation/antiplatelet therapy:   Plavix, Coumadin, Eliquis, Xarelto, Lovenox, Pradaxa, Brilinta, or Effient? no Aspirin? no  Patient confirms/reports the following medications:  Current Outpatient Medications  Medication Sig Dispense Refill  . aspirin EC 81 MG tablet Take 81 mg by mouth daily.    . Multiple Vitamins-Minerals (CENTRUM SILVER 50+MEN) TABS     . Omega-3 Fatty Acids (FISH OIL) 1000 MG CAPS Take by mouth. (Patient not taking: Reported on 11/17/2019)     No current facility-administered medications for this visit.    Patient confirms/reports the following allergies:  No Known Allergies  No orders of the defined types were placed in this encounter.   AUTHORIZATION INFORMATION Primary Insurance: 1D#: Group #:  Secondary Insurance: 1D#: Group #:  SCHEDULE INFORMATION: Date: Monday 12/21/19 Time: Location:[facility]

## 2019-11-30 ENCOUNTER — Encounter: Payer: Self-pay | Admitting: Family Medicine

## 2019-12-14 ENCOUNTER — Encounter: Payer: Self-pay | Admitting: Gastroenterology

## 2019-12-17 ENCOUNTER — Other Ambulatory Visit: Payer: Self-pay

## 2019-12-17 ENCOUNTER — Other Ambulatory Visit
Admission: RE | Admit: 2019-12-17 | Discharge: 2019-12-17 | Disposition: A | Discharge status: Home / Self Care | Payer: BC Managed Care – PPO | Source: Ambulatory Visit | Attending: Gastroenterology | Admitting: Gastroenterology

## 2019-12-17 DIAGNOSIS — Z01812 Encounter for preprocedural laboratory examination: Secondary | ICD-10-CM | POA: Insufficient documentation

## 2019-12-17 DIAGNOSIS — Z20822 Contact with and (suspected) exposure to covid-19: Secondary | ICD-10-CM | POA: Insufficient documentation

## 2019-12-17 LAB — SARS CORONAVIRUS 2 (TAT 6-24 HRS): SARS Coronavirus 2: NEGATIVE

## 2019-12-18 NOTE — Discharge Instructions (Signed)
General Anesthesia, Adult, Care After This sheet gives you information about how to care for yourself after your procedure. Your health care provider may also give you more specific instructions. If you have problems or questions, contact your health care provider. What can I expect after the procedure? After the procedure, the following side effects are common:  Pain or discomfort at the IV site.  Nausea.  Vomiting.  Sore throat.  Trouble concentrating.  Feeling cold or chills.  Weak or tired.  Sleepiness and fatigue.  Soreness and body aches. These side effects can affect parts of the body that were not involved in surgery. Follow these instructions at home:  For at least 24 hours after the procedure:  Have a responsible adult stay with you. It is important to have someone help care for you until you are awake and alert.  Rest as needed.  Do not: ? Participate in activities in which you could fall or become injured. ? Drive. ? Use heavy machinery. ? Drink alcohol. ? Take sleeping pills or medicines that cause drowsiness. ? Make important decisions or sign legal documents. ? Take care of children on your own. Eating and drinking  Follow any instructions from your health care provider about eating or drinking restrictions.  When you feel hungry, start by eating small amounts of foods that are soft and easy to digest (bland), such as toast. Gradually return to your regular diet.  Drink enough fluid to keep your urine pale yellow.  If you vomit, rehydrate by drinking water, juice, or clear broth. General instructions  If you have sleep apnea, surgery and certain medicines can increase your risk for breathing problems. Follow instructions from your health care provider about wearing your sleep device: ? Anytime you are sleeping, including during daytime naps. ? While taking prescription pain medicines, sleeping medicines, or medicines that make you drowsy.  Return to  your normal activities as told by your health care provider. Ask your health care provider what activities are safe for you.  Take over-the-counter and prescription medicines only as told by your health care provider.  If you smoke, do not smoke without supervision.  Keep all follow-up visits as told by your health care provider. This is important. Contact a health care provider if:  You have nausea or vomiting that does not get better with medicine.  You cannot eat or drink without vomiting.  You have pain that does not get better with medicine.  You are unable to pass urine.  You develop a skin rash.  You have a fever.  You have redness around your IV site that gets worse. Get help right away if:  You have difficulty breathing.  You have chest pain.  You have blood in your urine or stool, or you vomit blood. Summary  After the procedure, it is common to have a sore throat or nausea. It is also common to feel tired.  Have a responsible adult stay with you for the first 24 hours after general anesthesia. It is important to have someone help care for you until you are awake and alert.  When you feel hungry, start by eating small amounts of foods that are soft and easy to digest (bland), such as toast. Gradually return to your regular diet.  Drink enough fluid to keep your urine pale yellow.  Return to your normal activities as told by your health care provider. Ask your health care provider what activities are safe for you. This information is not   intended to replace advice given to you by your health care provider. Make sure you discuss any questions you have with your health care provider. Document Revised: 01/04/2017 Document Reviewed: 08/17/2016 Elsevier Patient Education  2020 [employer]vier [employer]  

## 2019-12-21 ENCOUNTER — Other Ambulatory Visit: Payer: Self-pay

## 2019-12-21 ENCOUNTER — Ambulatory Visit: Payer: BC Managed Care – PPO | Admitting: Anesthesiology

## 2019-12-21 ENCOUNTER — Encounter: Payer: Self-pay | Admitting: Gastroenterology

## 2019-12-21 ENCOUNTER — Encounter
Admission: RE | Disposition: A | Discharge status: Home / Self Care | Payer: Self-pay | Source: Home / Self Care | Attending: Gastroenterology

## 2019-12-21 ENCOUNTER — Ambulatory Visit
Admission: RE | Admit: 2019-12-21 | Discharge: 2019-12-21 | Disposition: A | Discharge status: Home / Self Care | Payer: BC Managed Care – PPO | Attending: Gastroenterology | Admitting: Gastroenterology

## 2019-12-21 DIAGNOSIS — Z7982 Long term (current) use of aspirin: Secondary | ICD-10-CM | POA: Diagnosis not present

## 2019-12-21 DIAGNOSIS — Z86010 Personal history of colonic polyps: Secondary | ICD-10-CM | POA: Insufficient documentation

## 2019-12-21 DIAGNOSIS — K5730 Diverticulosis of large intestine without perforation or abscess without bleeding: Secondary | ICD-10-CM | POA: Insufficient documentation

## 2019-12-21 DIAGNOSIS — K648 Other hemorrhoids: Secondary | ICD-10-CM | POA: Insufficient documentation

## 2019-12-21 DIAGNOSIS — Z1211 Encounter for screening for malignant neoplasm of colon: Secondary | ICD-10-CM | POA: Diagnosis not present

## 2019-12-21 HISTORY — PX: COLONOSCOPY WITH PROPOFOL: SHX5780

## 2019-12-21 SURGERY — COLONOSCOPY WITH PROPOFOL
Anesthesia: General | Site: Rectum

## 2019-12-21 MED ORDER — ONDANSETRON HCL 4 MG/2ML IJ SOLN: 4.0000 mg | Freq: Once | INTRAMUSCULAR | Status: DC | PRN

## 2019-12-21 MED ORDER — SODIUM CHLORIDE 0.9 % IV SOLN: INTRAVENOUS | Status: DC

## 2019-12-21 MED ORDER — PROPOFOL 10 MG/ML IV BOLUS
INTRAVENOUS | Status: DC | PRN
  Administered 2019-12-21: 30 mg via INTRAVENOUS
  Administered 2019-12-21: 40 mg via INTRAVENOUS
  Administered 2019-12-21 (×3): 30 mg via INTRAVENOUS
  Administered 2019-12-21: 140 mg via INTRAVENOUS

## 2019-12-21 MED ORDER — ACETAMINOPHEN 160 MG/5ML PO SOLN: 325.0000 mg | ORAL | Status: DC | PRN

## 2019-12-21 MED ORDER — STERILE WATER FOR IRRIGATION IR SOLN
Status: DC | PRN
  Administered 2019-12-21: .05 mL

## 2019-12-21 MED ORDER — LACTATED RINGERS IV SOLN: INTRAVENOUS | Status: DC

## 2019-12-21 MED ORDER — ACETAMINOPHEN 325 MG PO TABS: 325.0000 mg | ORAL_TABLET | ORAL | Status: DC | PRN

## 2019-12-21 MED ORDER — LIDOCAINE HCL (CARDIAC) PF 100 MG/5ML IV SOSY
PREFILLED_SYRINGE | INTRAVENOUS | Status: DC | PRN
  Administered 2019-12-21: 50 mg via INTRAVENOUS

## 2019-12-21 SURGICAL SUPPLY — 6 items
GOWN CVR UNV OPN BCK APRN NK (MISCELLANEOUS) ×2 IMPLANT
GOWN ISOL THUMB LOOP REG UNIV (MISCELLANEOUS) ×6
KIT PRC NS LF DISP ENDO (KITS) ×1 IMPLANT
KIT PROCEDURE OLYMPUS (KITS) ×3
MANIFOLD NEPTUNE II (INSTRUMENTS) ×3 IMPLANT
WATER STERILE IRR 250ML POUR (IV SOLUTION) ×3 IMPLANT

## 2019-12-21 NOTE — Anesthesia Preprocedure Evaluation (Signed)
Anesthesia Evaluation  Patient identified by MRN, date of birth, ID band Patient awake    Reviewed: Allergy & Precautions, NPO status   Airway Mallampati: II  TM Distance: >3 FB     Dental   Pulmonary    breath sounds clear to auscultation       Cardiovascular hypertension,  Rhythm:Regular Rate:Normal     Neuro/Psych    GI/Hepatic   Endo/Other    Renal/GU Hx kidney stones     Musculoskeletal   Abdominal   Peds  Hematology   Anesthesia Other Findings   Reproductive/Obstetrics                             Anesthesia Physical Anesthesia Plan  ASA: II  Anesthesia Plan: General   Post-op Pain Management:    Induction: Intravenous  PONV Risk Score and Plan: Propofol infusion, TIVA and Treatment may vary due to age or medical condition  Airway Management Planned: Natural Airway and Nasal Cannula  Additional Equipment:   Intra-op Plan:   Post-operative Plan:   Informed Consent: I have reviewed the patients History and Physical, chart, labs and discussed the procedure including the risks, benefits and alternatives for the proposed anesthesia with the patient or authorized representative who has indicated his/her understanding and acceptance.       Plan Discussed with: CRNA  Anesthesia Plan Comments:         Anesthesia Quick Evaluation

## 2019-12-21 NOTE — Transfer of Care (Signed)
Immediate Anesthesia Transfer of Care Note  Patient: Chase Brooks  Procedure(s) Performed: COLONOSCOPY WITH PROPOFOL (N/A Rectum)  Patient Location: PACU  Anesthesia Type: General  Level of Consciousness: awake, alert  and patient cooperative  Airway and Oxygen Therapy: Patient Spontanous Breathing and Patient connected to supplemental oxygen  Post-op Assessment: Post-op Vital signs reviewed, Patient's Cardiovascular Status Stable, Respiratory Function Stable, Patent Airway and No signs of Nausea or vomiting  Post-op Vital Signs: Reviewed and stable  Complications: No complications documented.

## 2019-12-21 NOTE — H&P (Signed)
Midge Minium, MD [facility] [address] [place], [place] [address] Phone:[phone] Fax : [phone]  Primary Care Physician:  Steele Sizer, MD Primary Gastroenterologist:  Dr. Servando Snare  Pre-Procedure History & Physical: HPI:  Chase Brooks is a 59 y.o. male is here for an colonoscopy.   Past Medical History:  Diagnosis Date  . Nephrolithiasis   . Vertigo     Past Surgical History:  Procedure Laterality Date  . COLONOSCOPY W/ POLYPECTOMY  03/26/2014   polypectomy x 2, repeat 5 years    Prior to Admission medications   Medication Sig Start Date End Date Taking? Authorizing Provider  aspirin EC 81 MG tablet Take 81 mg by mouth daily.   Yes [provider]  Multiple Vitamins-Minerals (CENTRUM SILVER 50+MEN) TABS  11/04/17  Yes [provider]  Omega-3 Fatty Acids (FISH OIL) 1000 MG CAPS Take by mouth. Patient not taking: Reported on 11/17/2019    [provider]    Allergies as of 11/17/2019  . (No Known Allergies)    Family History  Problem Relation Age of Onset  . Intracerebral hemorrhage Mother   . Cancer Neg Hx   . COPD Neg Hx   . Diabetes Neg Hx   . Heart disease Neg Hx   . Hypertension Neg Hx   . Stroke Neg Hx     Social History   Socioeconomic History  . Marital status: Married    Spouse name: Not on file  . Number of children: Not on file  . Years of education: Not on file  . Highest education level: Not on file  Occupational History  . Not on file  Tobacco Use  . Smoking status: Never Smoker  . Smokeless tobacco: Never Used  Vaping Use  . Vaping Use: Never used  Substance and Sexual Activity  . Alcohol use: No  . Drug use: No  . Sexual activity: Not on file  Other Topics Concern  . Not on file  Social History Narrative  . Not on file   Social Determinants of Health   Financial Resource Strain:   . Difficulty of Paying Living Expenses: Not on file  Food Insecurity:   . Worried About Patent examiner in the Last Year: Not on file  . Ran Out of Food in the Last Year: Not on file  Transportation Needs:   . Lack of Transportation (Medical): Not on file  . Lack of Transportation (Non-Medical): Not on file  Physical Activity:   . Days of Exercise per Week: Not on file  . Minutes of Exercise per Session: Not on file  Stress:   . Feeling of Stress : Not on file  Social Connections:   . Frequency of Communication with Friends and Family: Not on file  . Frequency of Social Gatherings with Friends and Family: Not on file  . Attends Religious Services: Not on file  . Active Member of Clubs or Organizations: Not on file  . Attends Banker Meetings: Not on file  . Marital Status: Not on file  Intimate Partner Violence:   . Fear of Current or Ex-Partner: Not on file  . Emotionally Abused: Not on file  . Physically Abused: Not on file  . Sexually Abused: Not on file    Review of Systems: See HPI, otherwise negative ROS  Physical Exam: BP (!) 156/88   Pulse 84   Temp (!) 97.5 F (36.4 C)   Ht 5' 8.5" (1.74 m)  Wt 89.4 kg   SpO2 97%   BMI 29.52 kg/m  General:   Alert,  pleasant and cooperative in NAD Head:  Normocephalic and atraumatic. Neck:  Supple; no masses or thyromegaly. Lungs:  Clear throughout to auscultation.    Heart:  Regular rate and rhythm. Abdomen:  Soft, nontender and nondistended. Normal bowel sounds, without guarding, and without rebound.   Neurologic:  Alert and  oriented x4;  grossly normal neurologically.  Impression/Plan: Chase Brooks is here for an colonoscopy to be performed for a history of adenomatous polyps on 03/2014   Risks, benefits, limitations, and alternatives regarding  colonoscopy have been reviewed with the patient.  Questions have been answered.  All parties agreeable.   Midge Minium, MD  12/21/2019, 7:34 AM

## 2019-12-21 NOTE — Anesthesia Postprocedure Evaluation (Signed)
Anesthesia Post Note  Patient: Chase Brooks  Procedure(s) Performed: COLONOSCOPY WITH PROPOFOL (N/A Rectum)     Patient location during evaluation: PACU Anesthesia Type: General Level of consciousness: awake Pain management: pain level controlled Vital Signs Assessment: post-procedure vital signs reviewed and stable Respiratory status: respiratory function stable Cardiovascular status: stable Postop Assessment: no signs of nausea or vomiting Anesthetic complications: no   No complications documented.  Jola Babinski

## 2019-12-21 NOTE — Anesthesia Procedure Notes (Signed)
Date/Time: 12/21/2019 7:47 AM Performed by: Maree Krabbe, CRNA Pre-anesthesia Checklist: Patient identified, Emergency Drugs available, Suction available, Timeout performed and Patient being monitored Patient Re-evaluated:Patient Re-evaluated prior to induction Oxygen Delivery Method: Nasal cannula Placement Confirmation: positive ETCO2

## 2019-12-21 NOTE — Op Note (Addendum)
[facility] Gastroenterology Patient Name: Chase Brooks Procedure Date: 12/21/2019 7:35 AM MRN: [id number] Account #: [id number] Date of Birth: [birth year] Admit Type: Outpatient Age: 59 Room: [facility] OR ROOM 01 Gender: Male Note Status: Finalized Procedure:             Colonoscopy Indications:           High risk colon cancer surveillance: Personal history                         of colonic polyps Providers:             Midge Minium MD, MD Referring MD:          Steele Sizer, MD (Referring MD) Medicines:             Propofol per Anesthesia Complications:         No immediate complications. Procedure:             Pre-Anesthesia Assessment:                        - Prior to the procedure, a History and Physical was                         performed, and patient medications and allergies were                         reviewed. The patient's tolerance of previous                         anesthesia was also reviewed. The risks and benefits                         of the procedure and the sedation options and risks                         were discussed with the patient. All questions were                         answered, and informed consent was obtained. Prior                         Anticoagulants: The patient has taken no previous                         anticoagulant or antiplatelet agents. ASA Grade                         Assessment: II - A patient with mild systemic disease.                         After reviewing the risks and benefits, the patient                         was deemed in satisfactory condition to undergo the                         procedure.  After obtaining informed consent, the colonoscope was                         passed under direct vision. Throughout the procedure,                         the patient's blood pressure, pulse, and oxygen                         saturations were monitored continuously. The was                          introduced through the anus and advanced to the the                         cecum, identified by appendiceal orifice and ileocecal                         valve. The colonoscopy was performed without                         difficulty. The patient tolerated the procedure well.                         The quality of the bowel preparation was excellent. Findings:      The perianal and digital rectal examinations were normal.      Multiple small-mouthed diverticula were found in the sigmoid colon.      Non-bleeding internal hemorrhoids were found during retroflexion. The       hemorrhoids were Grade I (internal hemorrhoids that do not prolapse). Impression:            - Diverticulosis in the sigmoid colon.                        - Non-bleeding internal hemorrhoids.                        - No specimens collected. Recommendation:        - Discharge patient to home.                        - Resume previous diet.                        - Continue present medications.                        - Repeat colonoscopy in 7 years for surveillance. Procedure Code(s):     --- Professional ---                        [phone], Colonoscopy, flexible; diagnostic, including                         collection of specimen(s) by brushing or washing, when                         performed (separate procedure) Diagnosis Code(s):     --- Professional ---  Z86.010, Personal history of colonic polyps CPT copyright 2019 American Medical Association. All rights reserved. The codes documented in this report are preliminary and upon coder review may  be revised to meet current compliance requirements. Midge Minium MD, MD 12/21/2019 8:05:59 AM This report has been signed electronically. Number of Addenda: 0 Note Initiated On: 12/21/2019 7:35 AM Scope Withdrawal Time: 0 hours 7 minutes 15 seconds  Total Procedure Duration: 0 hours 14 minutes 14 seconds  Estimated Blood Loss:  Estimated  blood loss: none.      [facility]

## 2019-12-22 ENCOUNTER — Encounter: Payer: Self-pay | Admitting: Gastroenterology

## 2019-12-25 NOTE — Telephone Encounter (Addendum)
You don't have to add it to the visit. Just tell me the code and I can give it to [facility] to reprocess the claim.

## 2020-09-29 ENCOUNTER — Ambulatory Visit
Admission: RE | Admit: 2020-09-29 | Discharge: 2020-09-29 | Disposition: A | Discharge status: Home / Self Care | Payer: BC Managed Care – PPO | Attending: Chiropractic Medicine | Admitting: Chiropractic Medicine

## 2020-09-29 ENCOUNTER — Other Ambulatory Visit: Payer: Self-pay | Admitting: Chiropractic Medicine

## 2020-09-29 ENCOUNTER — Ambulatory Visit
Admission: RE | Admit: 2020-09-29 | Discharge: 2020-09-29 | Disposition: A | Discharge status: Home / Self Care | Payer: BC Managed Care – PPO | Source: Ambulatory Visit | Attending: Chiropractic Medicine | Admitting: Chiropractic Medicine

## 2020-09-29 ENCOUNTER — Other Ambulatory Visit: Payer: Self-pay

## 2020-09-29 DIAGNOSIS — M545 Low back pain, unspecified: Secondary | ICD-10-CM | POA: Diagnosis not present

## 2020-09-29 IMAGING — CR DG LUMBAR SPINE COMPLETE 4+V
5 series · 5 of 5 positions shown · non-contrast
Comparison: Lumbar spine MRI [DATE] (report only)

CLINICAL DATA: Acute low back pain

EXAM:
LUMBAR SPINE - COMPLETE 4+ VIEW

[l-spine ap]
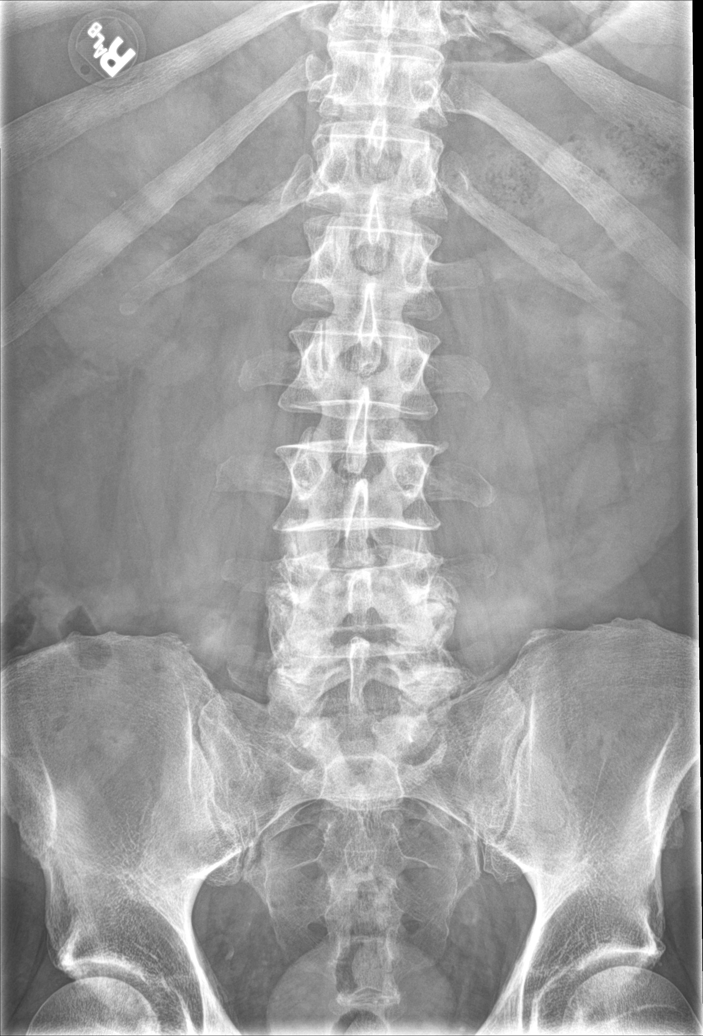

[l-spine obl (1 of 2)]
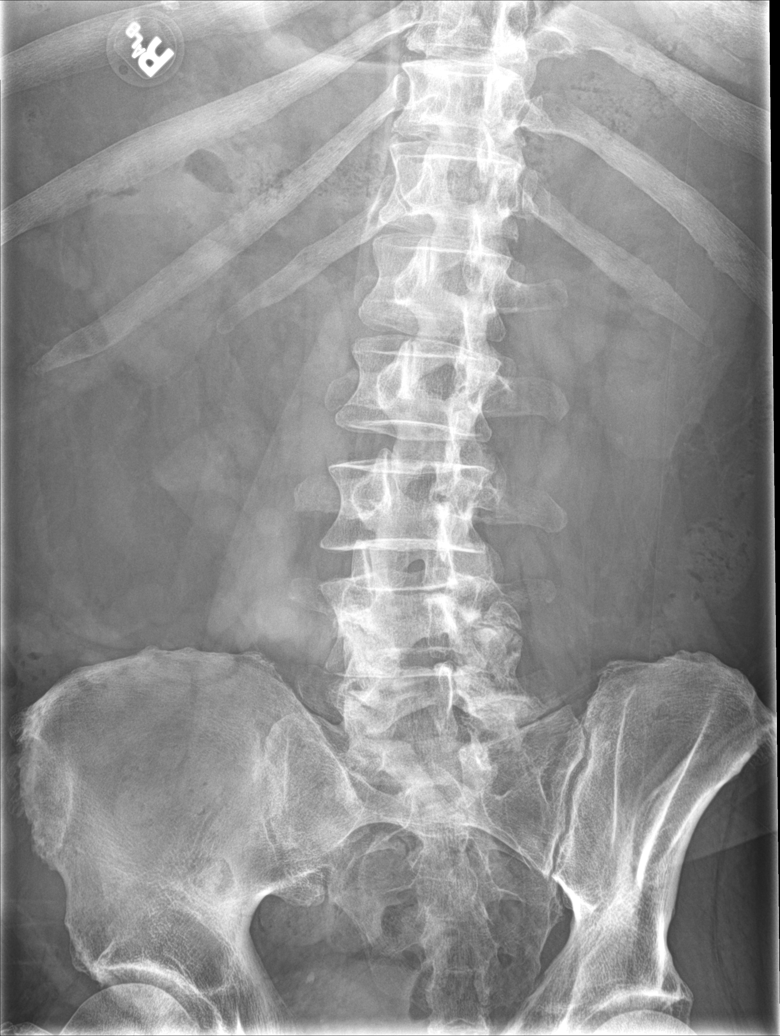

[l-spine obl (2 of 2)]
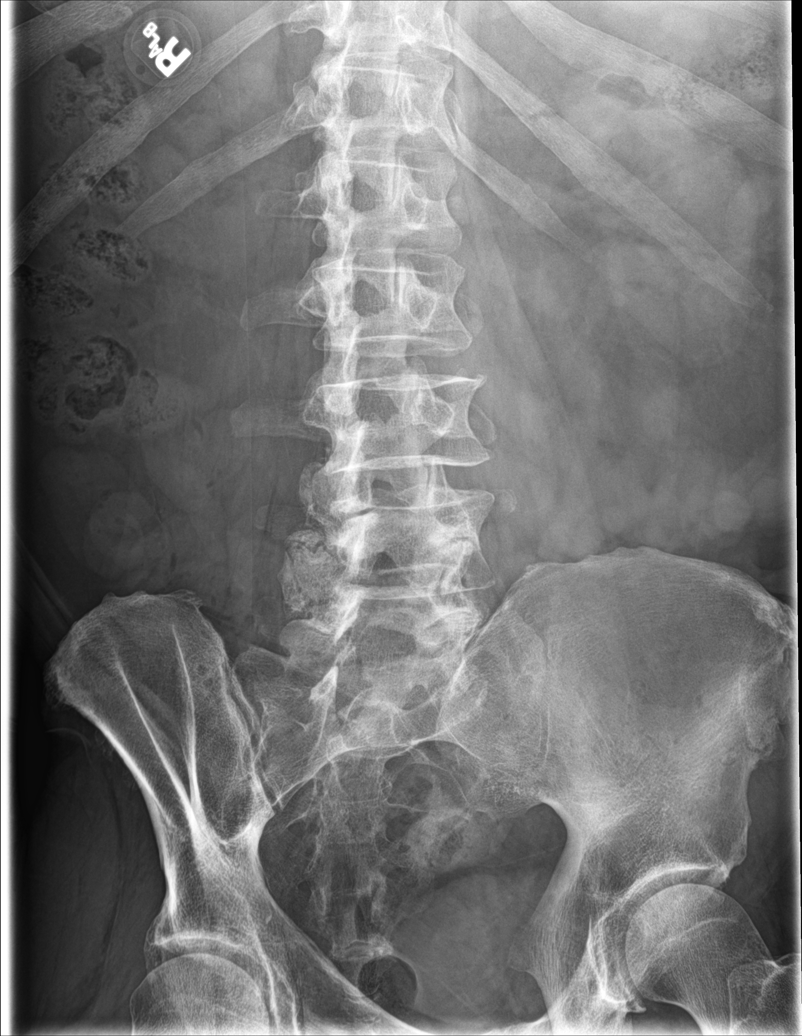

[l-spine lat]
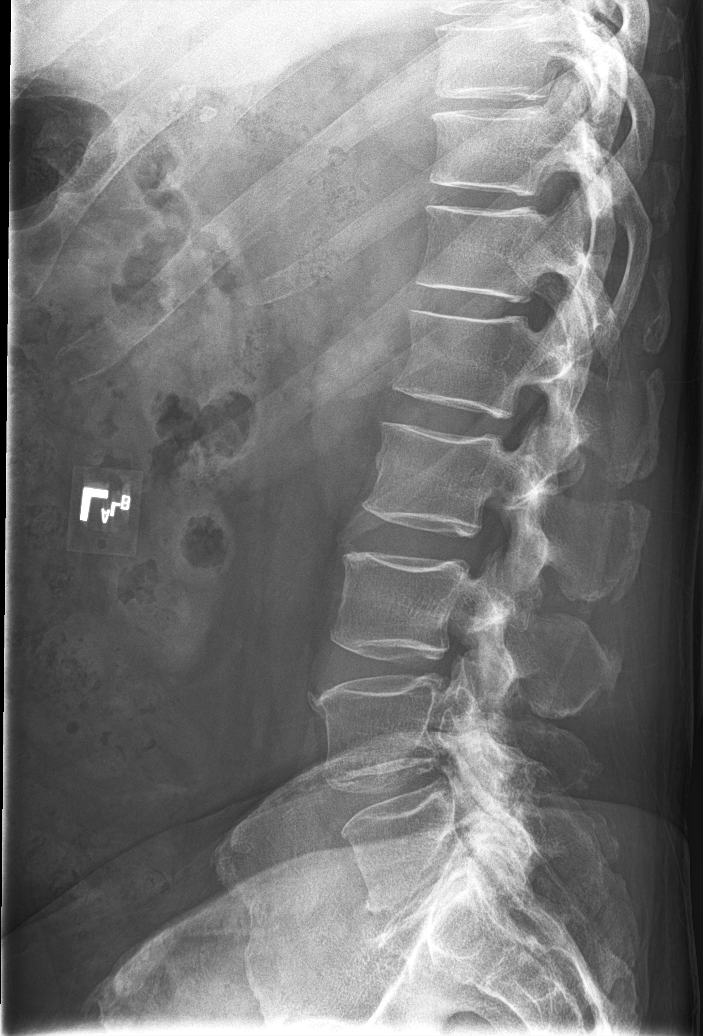

[l-spine spot]
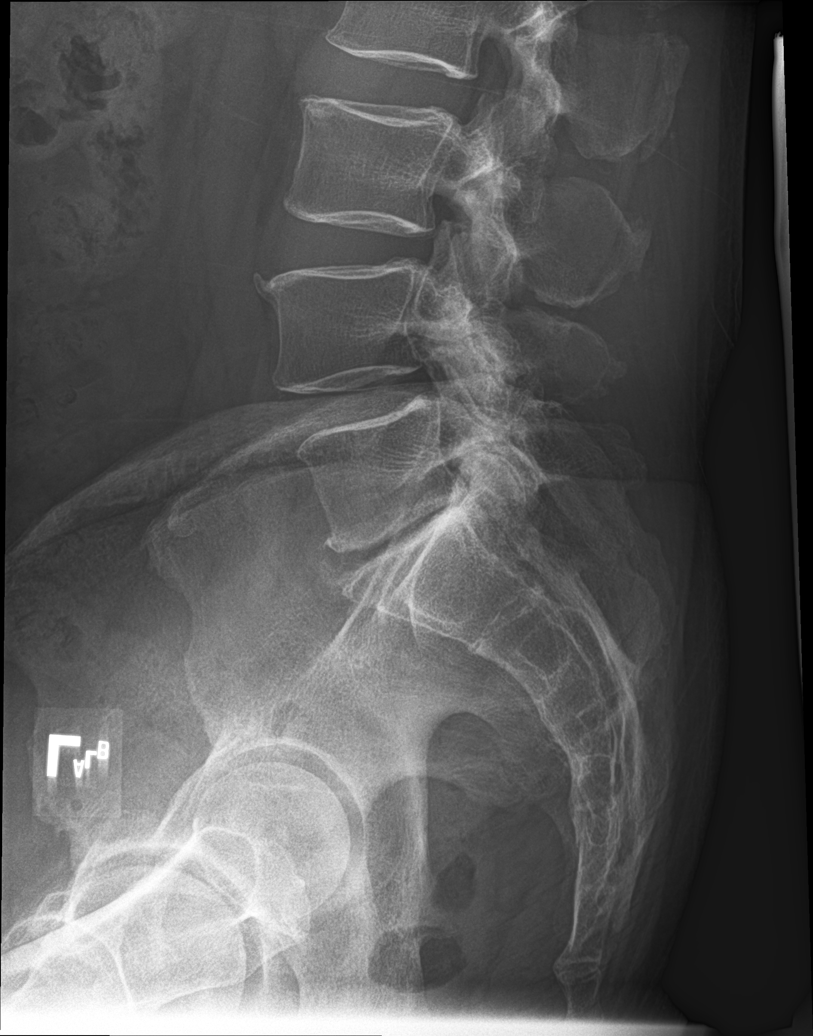

[5 of 5 positions shown; findings below may reference images not displayed]

FINDINGS: There are 5 non-rib-bearing lumbar type vertebral bodies. Vertebral
body heights are preserved, without evidence of acute fracture.
Alignment is normal. There is no spondylolysis.

There is intervertebral disc space narrowing at L5-S1. There is mild
degenerative endplate change in the remainder of the lumbar spine.
There is facet arthropathy at L4-L5 and L5-S1
IMPRESSION: 1. No acute findings.
2. Degenerative changes most advanced at L4-L5 and L5-S1.

## 2020-11-07 ENCOUNTER — Ambulatory Visit: Payer: Self-pay | Admitting: Family Medicine

## 2020-11-08 ENCOUNTER — Encounter: Payer: Self-pay | Admitting: Family Medicine

## 2020-11-08 ENCOUNTER — Telehealth: Payer: Self-pay | Admitting: Internal Medicine

## 2020-11-08 NOTE — Telephone Encounter (Signed)
Pt dropped off his X-rays for his physical next week. I placed them in the provider's folder.

## 2020-11-09 ENCOUNTER — Ambulatory Visit (INDEPENDENT_AMBULATORY_CARE_PROVIDER_SITE_OTHER): Payer: BC Managed Care – PPO | Admitting: Internal Medicine

## 2020-11-09 ENCOUNTER — Other Ambulatory Visit: Payer: Self-pay

## 2020-11-09 ENCOUNTER — Encounter: Payer: Self-pay | Admitting: Internal Medicine

## 2020-11-09 VITALS — BP 128/80 | HR 73 | Temp 98.6°F | Ht 68.5 in | Wt 202.4 lb

## 2020-11-09 DIAGNOSIS — M545 Low back pain, unspecified: Secondary | ICD-10-CM | POA: Diagnosis not present

## 2020-11-09 MED ORDER — METHYLPREDNISOLONE 4 MG PO TBPK: ORAL_TABLET | ORAL | 0 refills | Status: DC

## 2020-11-09 NOTE — Progress Notes (Signed)
Ht 5' 8.5" (1.74 m)   Wt 202 lb 6.4 oz (91.8 kg)   BMI 30.32 kg/m    Subjective:    Patient ID: Chase Brooks, male    DOB: 04-01-1960, 60 y.o.   MRN: [id number]  Chief Complaint  Patient presents with  . Back Pain    Has been having back pain since June 22, has h/o herniated disc L5 to S-1. Has been seeing a chiropractor for the pain, chiro ordered xray. Chiro suggested that he get a second opinion and a MRI. Patient would  like to know what is going on    HPI: Chase Brooks is a 60 y.o. male  Pt is here for back pain started in June walks around at work and home, had some soreness and now worsening. Saw a chiropractoer in sept and had an xray , had TENS tx, dry needling, cold laser and massage gun  Mainly in the centre of the L spine/ some to the left paraspinal area, no radiaiton to the lower ext.  has been to see chiroparacter and has had an xray was ordered  IMPRESSION: 1. No acute findings. 2. Degenerative changes most advanced at L4-L5 and L5-S1.  Back Pain This is a chronic problem. The problem occurs 2 to 4 times per day. Pertinent negatives include no abdominal pain, bladder incontinence, bowel incontinence, chest pain, dysuria, fever, headaches, leg pain, numbness, paresis, paresthesias, pelvic pain, perianal numbness, tingling, weakness or weight loss.   Chief Complaint  Patient presents with  . Back Pain    Has been having back pain since June 22, has h/o herniated disc L5 to S-1. Has been seeing a chiropractor for the pain, chiro ordered xray. Chiro suggested that he get a second opinion and a MRI. Patient would  like to know what is going on    Relevant past medical, surgical, family and social history reviewed and updated as indicated. Interim medical history since our last visit reviewed. Allergies and medications reviewed and updated.  Review of Systems  Constitutional:  Negative for fever and weight loss.  Cardiovascular:  Negative for chest pain.   Gastrointestinal:  Negative for abdominal pain and bowel incontinence.  Genitourinary:  Negative for bladder incontinence, dysuria and pelvic pain.  Musculoskeletal:  Positive for back pain.  Neurological:  Negative for tingling, weakness, numbness, headaches and paresthesias.   Per HPI unless specifically indicated above     Objective:    Ht 5' 8.5" (1.74 m)   Wt 202 lb 6.4 oz (91.8 kg)   BMI 30.32 kg/m   Wt Readings from Last 3 Encounters:  11/09/20 202 lb 6.4 oz (91.8 kg)  12/21/19 197 lb (89.4 kg)  11/06/19 200 lb (90.7 kg)    Physical Exam Vitals and nursing note reviewed.  Constitutional:      General: He is not in acute distress.    Appearance: Normal appearance. He is not ill-appearing or diaphoretic.  HENT:     Head: Normocephalic and atraumatic.     Right Ear: Tympanic membrane and external ear normal. There is no impacted cerumen.     Left Ear: External ear normal.     Nose: No congestion or rhinorrhea.     Mouth/Throat:     Pharynx: No oropharyngeal exudate or posterior oropharyngeal erythema.  Eyes:     Conjunctiva/sclera: Conjunctivae normal.     Pupils: Pupils are equal, round, and reactive to light.  Cardiovascular:     Rate and Rhythm: Normal rate  and regular rhythm.     Heart sounds: No murmur heard.   No friction rub. No gallop.  Pulmonary:     Effort: No respiratory distress.     Breath sounds: No stridor. No wheezing or rhonchi.  Chest:     Chest wall: No tenderness.  Abdominal:     General: Abdomen is flat. Bowel sounds are normal.     Palpations: Abdomen is soft. There is no mass.     Tenderness: There is no abdominal tenderness.  Musculoskeletal:        General: Tenderness present. No swelling, deformity or signs of injury.     Cervical back: Normal range of motion and neck supple. No rigidity or tenderness.     Right lower leg: No edema.     Left lower leg: No edema.  Skin:    General: Skin is warm and dry.  Neurological:     General:  No focal deficit present.     Mental Status: He is alert and oriented to person, place, and time. Mental status is at baseline.     Cranial Nerves: No cranial nerve deficit.     Sensory: No sensory deficit.   Results for orders placed or performed during the hospital encounter of 12/17/19  SARS CORONAVIRUS 2 (TAT 6-24 HRS) Nasopharyngeal Nasopharyngeal Swab   Specimen: Nasopharyngeal Swab  Result Value Ref Range   SARS Coronavirus 2 NEGATIVE NEGATIVE        Current Outpatient Medications:  .  aspirin EC 81 MG tablet, Take 81 mg by mouth daily., Disp: , Rfl:  .  Aspirin-Salicylamide-Caffeine (ARTHRITIS STRENGTH BC POWDER PO), Take by mouth., Disp: , Rfl:  .  ibuprofen (ADVIL) 600 MG tablet, Take 600 mg by mouth every 6 (six) hours as needed., Disp: , Rfl:  .  Multiple Vitamins-Minerals (CENTRUM SILVER 50+MEN) TABS, , Disp: , Rfl:  .  Omega-3 Fatty Acids (FISH OIL) 1000 MG CAPS, Take by mouth., Disp: , Rfl:     Assessment & Plan:   Back pain : start pt on medrol dose pak  Refer to ortho  Check MRI of L spine  Xrays show DJD of the spine MPRESSION: 1. No acute findings. 2. Degenerative changes most advanced at L4-L5 and L5-S1.   Problem List Items Addressed This Visit   None    No orders of the defined types were placed in this encounter.    No orders of the defined types were placed in this encounter.    Follow up plan: No follow-ups on file.

## 2020-11-10 LAB — CBC WITH DIFFERENTIAL/PLATELET
Basophils Absolute: 0 10*3/uL (ref 0.0–0.2)
Basos: 1 %
EOS (ABSOLUTE): 0.1 10*3/uL (ref 0.0–0.4)
Eos: 2 %
Hematocrit: 46.5 % (ref 37.5–51.0)
Hemoglobin: 16.6 g/dL (ref 13.0–17.7)
Immature Grans (Abs): 0 10*3/uL (ref 0.0–0.1)
Immature Granulocytes: 0 %
Lymphocytes Absolute: 2.2 10*3/uL (ref 0.7–3.1)
Lymphs: 34 %
MCH: 32.4 pg (ref 26.6–33.0)
MCHC: 35.7 g/dL (ref 31.5–35.7)
MCV: 91 fL (ref 79–97)
Monocytes Absolute: 0.6 10*3/uL (ref 0.1–0.9)
Monocytes: 9 %
Neutrophils Absolute: 3.6 10*3/uL (ref 1.4–7.0)
Neutrophils: 54 %
Platelets: 237 10*3/uL (ref 150–450)
RBC: 5.12 x10E6/uL (ref 4.14–5.80)
RDW: 12.5 % (ref 11.6–15.4)
WBC: 6.5 10*3/uL (ref 3.4–10.8)

## 2020-11-10 LAB — COMPREHENSIVE METABOLIC PANEL
ALT: 12 IU/L (ref 0–44)
AST: 21 IU/L (ref 0–40)
Albumin/Globulin Ratio: 1.9 (ref 1.2–2.2)
Albumin: 4.5 g/dL (ref 3.8–4.9)
Alkaline Phosphatase: 105 IU/L (ref 44–121)
BUN/Creatinine Ratio: 12 (ref 9–20)
BUN: 11 mg/dL (ref 6–24)
Bilirubin Total: 0.2 mg/dL (ref 0.0–1.2)
CO2: 25 mmol/L (ref 20–29)
Calcium: 9.3 mg/dL (ref 8.7–10.2)
Chloride: 103 mmol/L (ref 96–106)
Creatinine, Ser: 0.9 mg/dL (ref 0.76–1.27)
Globulin, Total: 2.4 g/dL (ref 1.5–4.5)
Glucose: 78 mg/dL (ref 70–99)
Potassium: 4.2 mmol/L (ref 3.5–5.2)
Sodium: 140 mmol/L (ref 134–144)
Total Protein: 6.9 g/dL (ref 6.0–8.5)
eGFR: 99 mL/min/{1.73_m2} (ref >59)

## 2020-11-11 ENCOUNTER — Encounter: Payer: Self-pay | Admitting: Internal Medicine

## 2020-11-11 DIAGNOSIS — M5416 Radiculopathy, lumbar region: Secondary | ICD-10-CM | POA: Insufficient documentation

## 2020-11-14 ENCOUNTER — Encounter: Payer: Self-pay | Admitting: Internal Medicine

## 2020-11-15 ENCOUNTER — Other Ambulatory Visit: Payer: Self-pay

## 2020-11-15 ENCOUNTER — Encounter: Payer: Self-pay | Admitting: Internal Medicine

## 2020-11-15 ENCOUNTER — Ambulatory Visit (INDEPENDENT_AMBULATORY_CARE_PROVIDER_SITE_OTHER): Payer: BC Managed Care – PPO | Admitting: Internal Medicine

## 2020-11-15 VITALS — BP 148/87 | HR 76 | Temp 98.3°F | Ht 68.0 in | Wt 195.0 lb

## 2020-11-15 DIAGNOSIS — G629 Polyneuropathy, unspecified: Secondary | ICD-10-CM | POA: Diagnosis not present

## 2020-11-15 DIAGNOSIS — M5440 Lumbago with sciatica, unspecified side: Secondary | ICD-10-CM

## 2020-11-15 DIAGNOSIS — G8929 Other chronic pain: Secondary | ICD-10-CM | POA: Insufficient documentation

## 2020-11-15 MED ORDER — METHYLPREDNISOLONE SODIUM SUCC 40 MG IJ SOLR: 120.0000 mg | Freq: Once | INTRAMUSCULAR | Status: DC

## 2020-11-15 MED ORDER — GABAPENTIN 100 MG PO CAPS: 100.0000 mg | ORAL_CAPSULE | Freq: Every day | ORAL | 1 refills | Status: DC

## 2020-11-15 MED ORDER — METHYLPREDNISOLONE SODIUM SUCC 40 MG IJ SOLR
40.0000 mg | Freq: Once | INTRAMUSCULAR | Status: AC
  Administered 2020-11-15: 120 mg via INTRAMUSCULAR

## 2020-11-15 NOTE — Progress Notes (Signed)
BP (!) 148/87   Pulse 76   Temp 98.3 F (36.8 C) (Oral)   Ht 5' 8"  (1.727 m)   Wt 195 lb (88.5 kg)   SpO2 97%   BMI 29.65 kg/m    Subjective:    Patient ID: Chase Brooks, male    DOB: 1960-12-19, 60 y.o.   MRN: [id number]  Chief Complaint  Patient presents with   Annual Exam   Back Pain    Back pain since June. Has been to ortho on Friday, ortho needs a MRI. Legs are getting weaker.    HPI: Chase Brooks is a 60 y.o. male  Back Pain This is a chronic (friday and saturday were better.seen ortho NP - @) problem. The current episode started more than 1 month ago (walking worsening. sunday). The problem has been gradually worsening since onset. The pain is present in the lumbar spine. The pain is at a severity of 5/10.   Chief Complaint  Patient presents with   Annual Exam   Back Pain    Back pain since June. Has been to ortho on Friday, ortho needs a MRI. Legs are getting weaker.    Relevant past medical, surgical, family and social history reviewed and updated as indicated. Interim medical history since our last visit reviewed. Allergies and medications reviewed and updated.  Review of Systems  Musculoskeletal:  Positive for back pain.   Per HPI unless specifically indicated above     Objective:    BP (!) 148/87   Pulse 76   Temp 98.3 F (36.8 C) (Oral)   Ht 5' 8"  (1.727 m)   Wt 195 lb (88.5 kg)   SpO2 97%   BMI 29.65 kg/m   Wt Readings from Last 3 Encounters:  11/15/20 195 lb (88.5 kg)  11/09/20 202 lb 6.4 oz (91.8 kg)  12/21/19 197 lb (89.4 kg)    Physical Exam Vitals and nursing note reviewed.  Constitutional:      General: He is not in acute distress.    Appearance: Normal appearance. He is not ill-appearing or diaphoretic.  Musculoskeletal:        General: No swelling, tenderness, deformity or signs of injury.     Right lower leg: No edema.     Left lower leg: No edema.     Comments: No tenderness on exam , no paraspinal ms pain /  tenderness either.   Skin:    General: Skin is warm and dry.  Neurological:     Mental Status: He is alert.  Psychiatric:        Mood and Affect: Mood normal.        Behavior: Behavior normal.        Thought Content: Thought content normal.        Judgment: Judgment normal.    Results for orders placed or performed in visit on 11/09/20  CBC with Differential/Platelet  Result Value Ref Range   WBC 6.5 3.4 - 10.8 x10E3/uL   RBC 5.12 4.14 - 5.80 x10E6/uL   Hemoglobin 16.6 13.0 - 17.7 g/dL   Hematocrit 46.5 37.5 - 51.0 %   MCV 91 79 - 97 fL   MCH 32.4 26.6 - 33.0 pg   MCHC 35.7 31.5 - 35.7 g/dL   RDW 12.5 11.6 - 15.4 %   Platelets 237 150 - 450 x10E3/uL   Neutrophils 54 Not Estab. %   Lymphs 34 Not Estab. %   Monocytes 9 Not Estab. %  Eos 2 Not Estab. %   Basos 1 Not Estab. %   Neutrophils Absolute 3.6 1.4 - 7.0 x10E3/uL   Lymphocytes Absolute 2.2 0.7 - 3.1 x10E3/uL   Monocytes Absolute 0.6 0.1 - 0.9 x10E3/uL   EOS (ABSOLUTE) 0.1 0.0 - 0.4 x10E3/uL   Basophils Absolute 0.0 0.0 - 0.2 x10E3/uL   Immature Granulocytes 0 Not Estab. %   Immature Grans (Abs) 0.0 0.0 - 0.1 x10E3/uL  Comprehensive metabolic panel  Result Value Ref Range   Glucose 78 70 - 99 mg/dL   BUN 11 6 - 24 mg/dL   Creatinine, Ser 0.90 0.76 - 1.27 mg/dL   eGFR 99 >59 mL/min/1.73   BUN/Creatinine Ratio 12 9 - 20   Sodium 140 134 - 144 mmol/L   Potassium 4.2 3.5 - 5.2 mmol/L   Chloride 103 96 - 106 mmol/L   CO2 25 20 - 29 mmol/L   Calcium 9.3 8.7 - 10.2 mg/dL   Total Protein 6.9 6.0 - 8.5 g/dL   Albumin 4.5 3.8 - 4.9 g/dL   Globulin, Total 2.4 1.5 - 4.5 g/dL   Albumin/Globulin Ratio 1.9 1.2 - 2.2   Bilirubin Total 0.2 0.0 - 1.2 mg/dL   Alkaline Phosphatase 105 44 - 121 IU/L   AST 21 0 - 40 IU/L   ALT 12 0 - 44 IU/L        Current Outpatient Medications:    Aspirin-Salicylamide-Caffeine (ARTHRITIS STRENGTH BC POWDER PO), Take by mouth., Disp: , Rfl:    gabapentin (NEURONTIN) 100 MG capsule, Take 1  capsule (100 mg total) by mouth at bedtime., Disp: 30 capsule, Rfl: 1   ibuprofen (ADVIL) 600 MG tablet, Take 600 mg by mouth every 6 (six) hours as needed., Disp: , Rfl:    Multiple Vitamins-Minerals (CENTRUM SILVER 50+MEN) TABS, , Disp: , Rfl:    aspirin EC 81 MG tablet, Take 81 mg by mouth daily. (Patient not taking: Reported on 11/15/2020), Disp: , Rfl:    methylPREDNISolone (MEDROL DOSEPAK) 4 MG TBPK tablet, Use as directed, Disp: 1 each, Rfl: 0   Omega-3 Fatty Acids (FISH OIL) 1000 MG CAPS, Take by mouth., Disp: , Rfl:     Assessment & Plan:  1.back pain ;  Worsening, no weakness on exam, however, pt says he has been feeling weaker when he walks  Seen ortho no notes available, pt isnt happy with care provided there. Would like to call and d/w [facility] and see what his options are otherwise would let us know about another referral /office.  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  Advised to take such at night to look for side effects of drowsiness since patient is working full-time. To call the office if has any reaction/to go to the ER if he has symptoms of an emergency which were discussed at length with him and his wife including worsening weakness new onset of bowel or bladder incontinence or any new onset of worsening tingling and numbness and unbearable/ worsening back pain.    Problem List Items Addressed This Visit   None   No orders of the defined types were placed in this encounter.    Meds ordered this encounter  Medications   gabapentin (NEURONTIN) 100 MG capsule    Sig: Take 1 capsule (100 mg total) by mouth at bedtime.  Dispense:  30 capsule    Refill:  1     Follow up plan: No follow-ups on file.

## 2020-11-15 NOTE — Addendum Note (Signed)
Addended by: Leward Quan A on: 11/15/2020 10:33 AM   Modules accepted: Orders

## 2020-11-16 ENCOUNTER — Encounter: Payer: Self-pay | Admitting: Family Medicine

## 2020-11-21 ENCOUNTER — Other Ambulatory Visit: Payer: Self-pay | Admitting: Sports Medicine

## 2020-11-21 ENCOUNTER — Other Ambulatory Visit (HOSPITAL_COMMUNITY): Payer: Self-pay | Admitting: Sports Medicine

## 2020-11-21 DIAGNOSIS — G8929 Other chronic pain: Secondary | ICD-10-CM

## 2020-11-21 DIAGNOSIS — M5442 Lumbago with sciatica, left side: Secondary | ICD-10-CM

## 2020-11-29 ENCOUNTER — Ambulatory Visit
Admission: RE | Admit: 2020-11-29 | Discharge: 2020-11-29 | Disposition: A | Discharge status: Home / Self Care | Payer: BC Managed Care – PPO | Source: Ambulatory Visit | Attending: Sports Medicine | Admitting: Sports Medicine

## 2020-11-29 ENCOUNTER — Other Ambulatory Visit: Payer: Self-pay

## 2020-11-29 DIAGNOSIS — M5442 Lumbago with sciatica, left side: Secondary | ICD-10-CM | POA: Insufficient documentation

## 2020-11-29 DIAGNOSIS — M5441 Lumbago with sciatica, right side: Secondary | ICD-10-CM | POA: Diagnosis present

## 2020-11-29 DIAGNOSIS — G8929 Other chronic pain: Secondary | ICD-10-CM | POA: Insufficient documentation

## 2020-11-29 IMAGING — MR MR LUMBAR SPINE W/O CM
5 series · 31 of 48 positions shown · non-contrast
Comparison: X-ray lumbar [DATE].

CLINICAL DATA: Low back pain with left leg numbness to foot for 6
months.

EXAM:
MRI LUMBAR SPINE WITHOUT CONTRAST
TECHNIQUE: Multiplanar, multisequence MR imaging of the lumbar spine was
performed. No intravenous contrast was administered.

[Series 5: T2 · sagittal · 4.0mm · 0.81mm/px · 6 of 17 slices shown (1 of 2)]
[im 1/17]
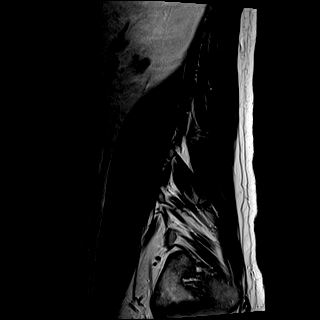
[im 4/17]
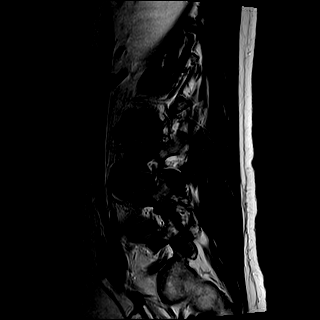
[im 7/17]
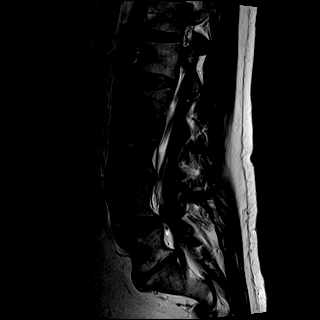
[im 10/17]
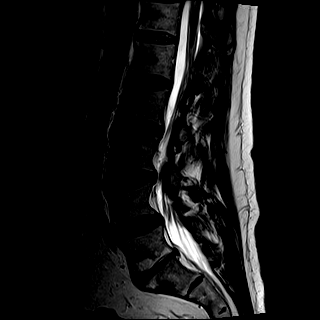
[im 13/17]
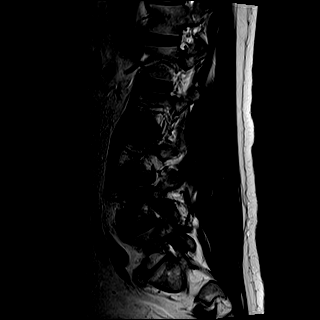
[im 17/17]
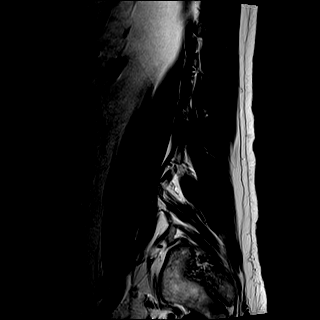

[Series 6: T1 · sagittal · 4.0mm · 0.81mm/px · 7 of 17 slices shown (1 of 2)]
[im 1/17]
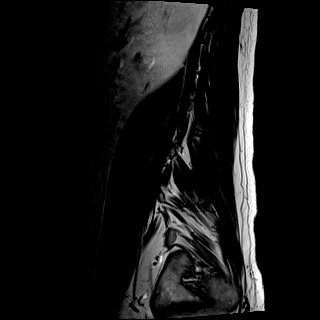
[im 3/17]
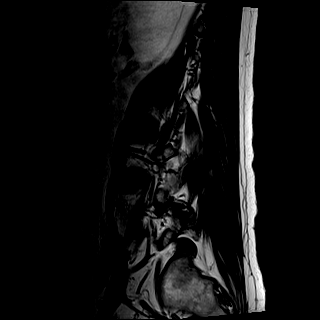
[im 6/17]
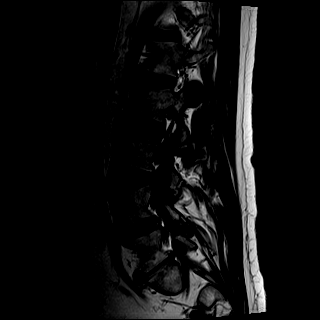
[im 9/17]
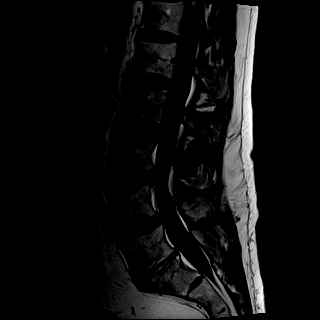
[im 11/17]
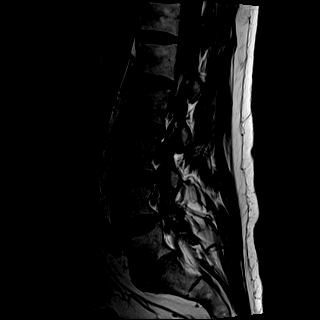
[im 14/17]
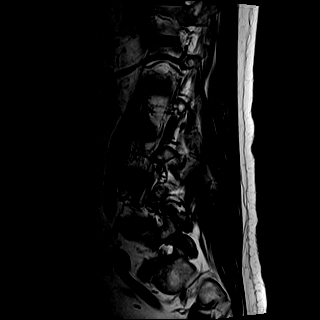
[im 17/17]
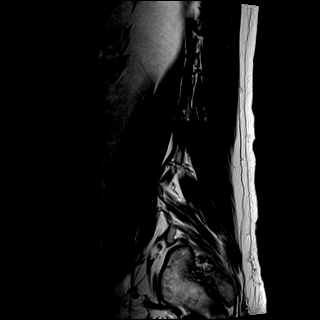

[Series 7: STIR · sagittal · 4.0mm · 0.41mm/px · 2 of 17 slices shown]
[im 1/17]
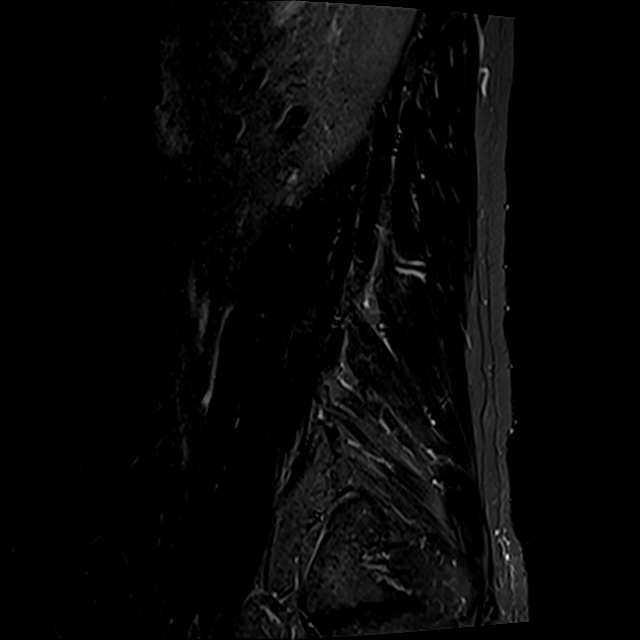
[im 3/17]
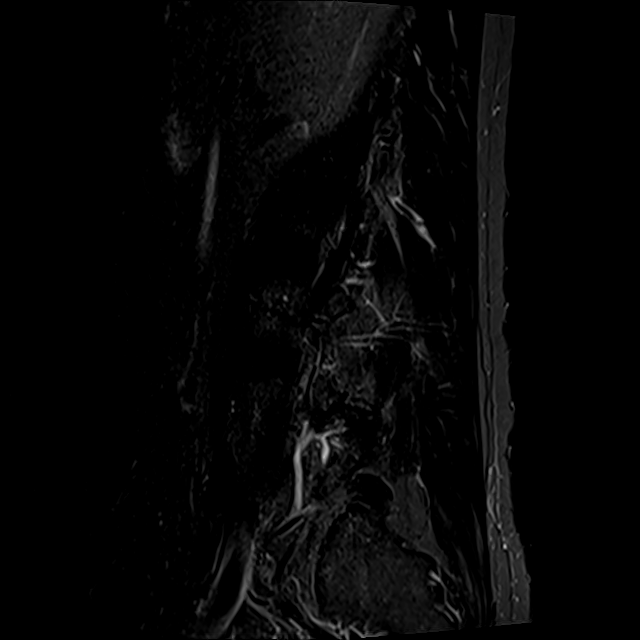

[Series 8: T2 · axial · 4.0mm · 0.78mm/px · z∈[-92,+126]mm · 8 of 36 slices shown (2 of 2)]
[im 1/36]
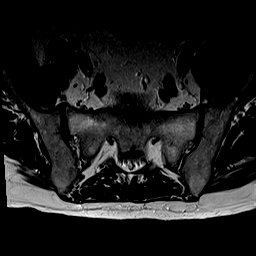
[im 6/36]
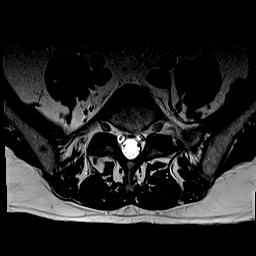
[im 11/36]
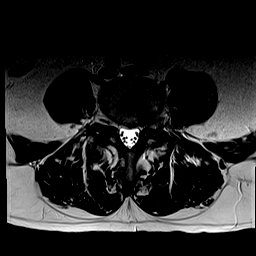
[im 17/36]
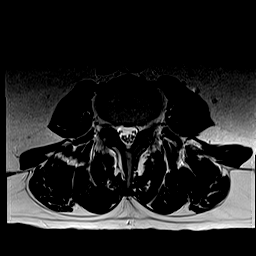
[im 19/36]
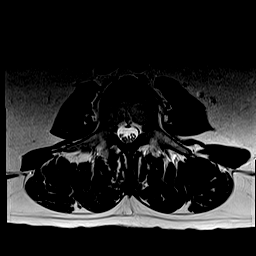
[im 25/36]
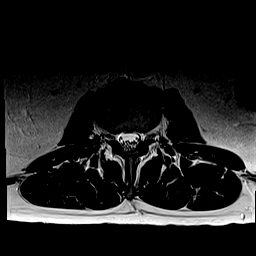
[im 30/36]
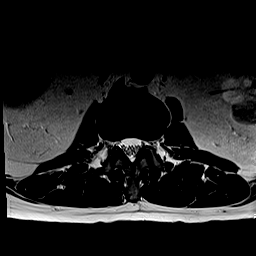
[im 36/36]
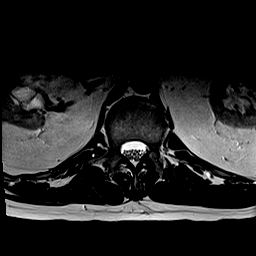

[Series 9: T1 · axial · 4.0mm · 0.39mm/px · z∈[-92,+126]mm · 8 of 36 slices shown (2 of 2)]
[im 1/36]
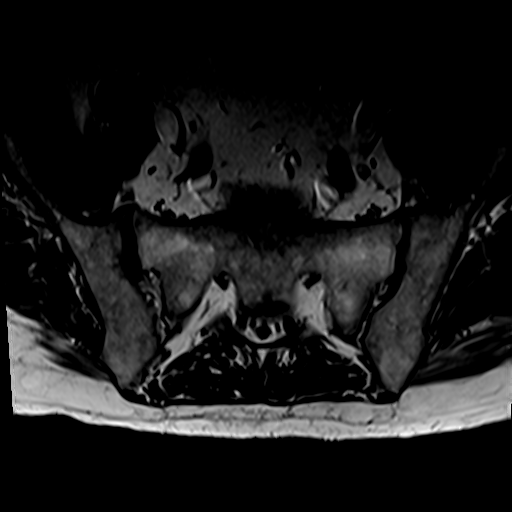
[im 6/36]
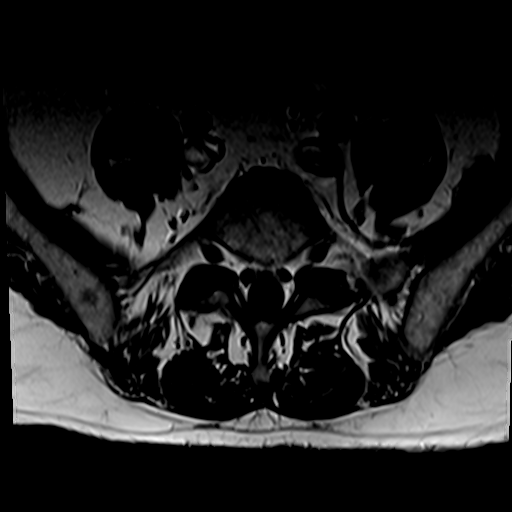
[im 11/36]
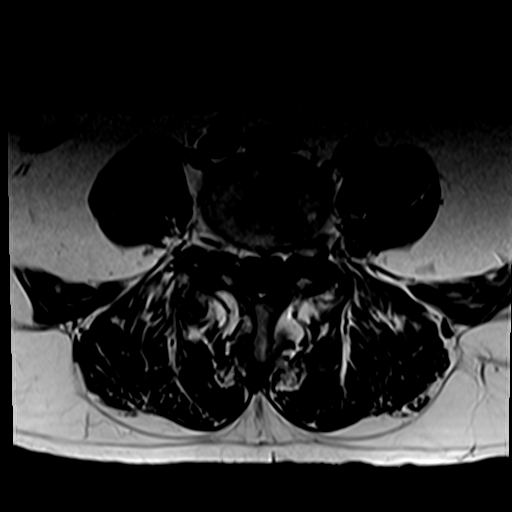
[im 17/36]
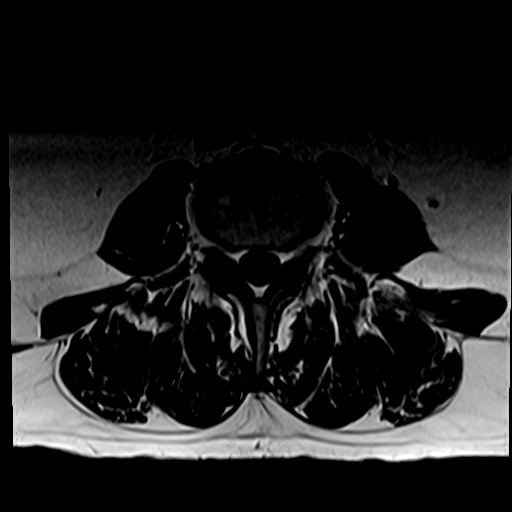
[im 19/36]
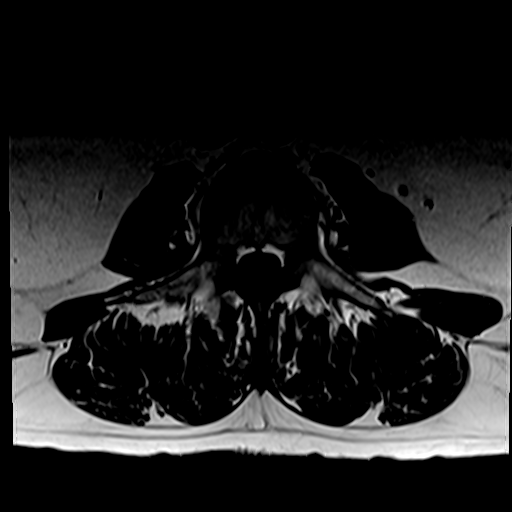
[im 25/36]
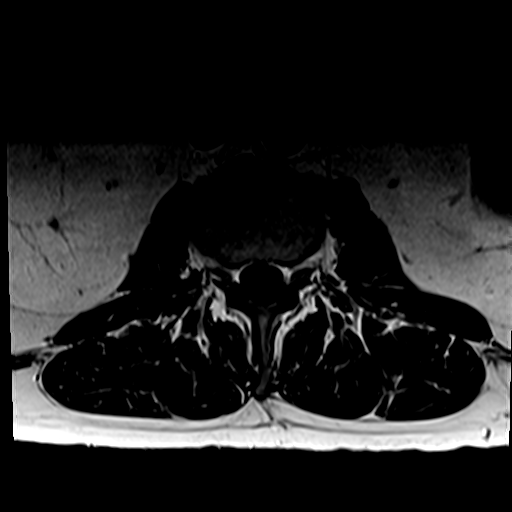
[im 30/36]
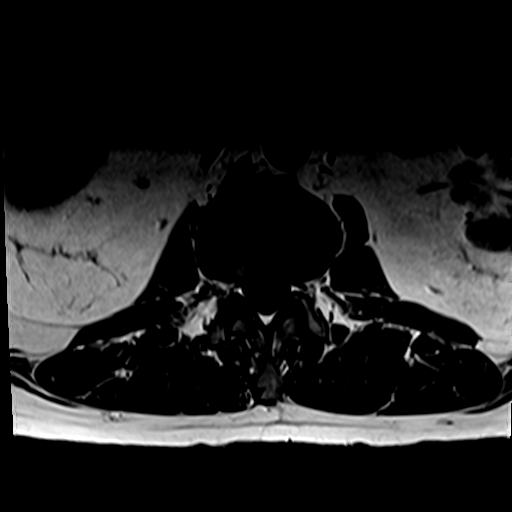
[im 36/36]
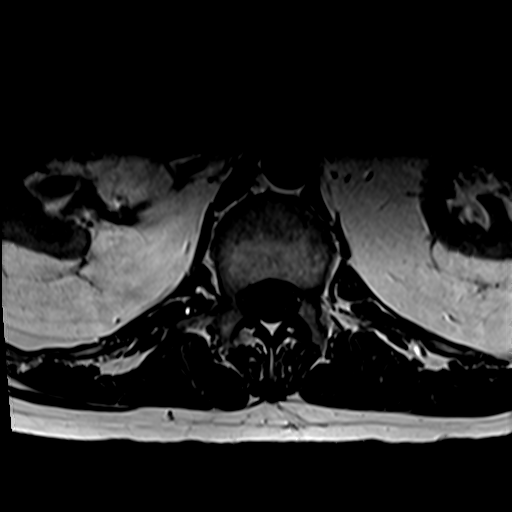

[31 of 48 positions shown; findings below may reference images not displayed]

FINDINGS: Segmentation:  Standard.

Alignment: Minimal grade 1 anterolisthesis of L4 on L5. 2 mm
retrolisthesis of L5 on S1.

Vertebrae: No acute fracture, evidence of discitis, or aggressive
bone lesion.

Conus medullaris and cauda equina: Conus extends to the T12 level.
Conus and cauda equina appear normal.

Paraspinal and other soft tissues: No acute paraspinal abnormality.

Disc levels:

Disc spaces: Degenerative disease with disc height loss at L5-S1.
Disc desiccation at L3-4 and L4-5.

T12-L1: No significant disc bulge. No neural foraminal stenosis. No
central canal stenosis.

L1-L2: No significant disc bulge. No neural foraminal stenosis. No
central canal stenosis.

L2-L3: No significant disc bulge. No neural foraminal stenosis. No
central canal stenosis.

L3-L4: Mild broad-based disc bulge with bilateral foraminal annular
fissures. Mild bilateral facet arthropathy. Bilateral lateral recess
narrowing. Mild bilateral foraminal stenosis.

L4-L5: Broad-based disc bulge. Moderate bilateral facet arthropathy.
Mild bilateral foraminal stenosis. No central canal stenosis.

L5-S1: Broad-based disc osteophyte complex. Mild left facet
arthropathy. No foraminal stenosis. No central canal stenosis.
IMPRESSION: 1. Lumbar spine spondylosis as described above.
2. No acute osseous injury of the lumbar spine.

## 2020-12-06 ENCOUNTER — Other Ambulatory Visit: Payer: Self-pay

## 2020-12-06 ENCOUNTER — Encounter: Payer: Self-pay | Admitting: Emergency Medicine

## 2020-12-06 ENCOUNTER — Emergency Department
Admission: EM | Admit: 2020-12-06 | Discharge: 2020-12-06 | Disposition: A | Discharge status: Home / Self Care | Payer: BC Managed Care – PPO | Attending: Emergency Medicine | Admitting: Emergency Medicine

## 2020-12-06 DIAGNOSIS — Z7982 Long term (current) use of aspirin: Secondary | ICD-10-CM | POA: Insufficient documentation

## 2020-12-06 DIAGNOSIS — M545 Low back pain, unspecified: Secondary | ICD-10-CM | POA: Insufficient documentation

## 2020-12-06 DIAGNOSIS — I10 Essential (primary) hypertension: Secondary | ICD-10-CM | POA: Diagnosis not present

## 2020-12-06 MED ORDER — DEXAMETHASONE SODIUM PHOSPHATE 10 MG/ML IJ SOLN
10.0000 mg | Freq: Once | INTRAMUSCULAR | Status: AC
  Administered 2020-12-06: 10 mg via INTRAMUSCULAR
  Filled 2020-12-06: qty 1

## 2020-12-06 MED ORDER — MELOXICAM 15 MG PO TABS: 15.0000 mg | ORAL_TABLET | Freq: Every day | ORAL | 0 refills | Status: DC

## 2020-12-06 MED ORDER — KETOROLAC TROMETHAMINE 30 MG/ML IJ SOLN
30.0000 mg | Freq: Once | INTRAMUSCULAR | Status: AC
  Administered 2020-12-06: 30 mg via INTRAMUSCULAR
  Filled 2020-12-06: qty 1

## 2020-12-06 MED ORDER — GABAPENTIN 300 MG PO CAPS: 300.0000 mg | ORAL_CAPSULE | Freq: Three times a day (TID) | ORAL | 0 refills | Status: DC

## 2020-12-06 MED ORDER — MORPHINE SULFATE (PF) 4 MG/ML IV SOLN
4.0000 mg | Freq: Once | INTRAVENOUS | Status: AC
  Administered 2020-12-06: 4 mg via INTRAMUSCULAR
  Filled 2020-12-06: qty 1

## 2020-12-06 MED ORDER — PREDNISONE 50 MG PO TABS: 50.0000 mg | ORAL_TABLET | Freq: Every day | ORAL | 0 refills | Status: DC

## 2020-12-06 MED ORDER — OXYCODONE-ACETAMINOPHEN 5-325 MG PO TABS
1.0000 | ORAL_TABLET | Freq: Four times a day (QID) | ORAL | 0 refills | Status: DC | PRN
Start: 2020-12-06 — End: 2021-02-20

## 2020-12-06 MED ORDER — METHOCARBAMOL 500 MG PO TABS
1000.0000 mg | ORAL_TABLET | Freq: Once | ORAL | Status: AC
  Administered 2020-12-06: 1000 mg via ORAL
  Filled 2020-12-06: qty 2

## 2020-12-06 MED ORDER — METHOCARBAMOL 500 MG PO TABS: 500.0000 mg | ORAL_TABLET | Freq: Four times a day (QID) | ORAL | 0 refills | Status: DC

## 2020-12-06 NOTE — ED Triage Notes (Signed)
Pt comes into the ED via POV c/o low back pain that he has been having since June.  Pt is scheduled for spinal injections on Monday but states the pain is getting worse and he is having a hard time ambulating at this time.  Pt denies any problems with urination or defecation.  Pt explains he has been taking gabapentin, cyclobenzaprine, ibuprofen, and tylenol with no relief.  PT in NAD at this time with even and unlabored respirations.

## 2020-12-06 NOTE — ED Notes (Signed)
Patients states lower part of back started hurting in June. 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 When he got up from W/C back locked on him and he could not get back up. Was sent here to ED to get pain under control. Pain level 5 in lower back. Wife in room

## 2020-12-06 NOTE — ED Provider Notes (Signed)
[facility] Emergency Department Provider Note  ____________________________________________  Time seen: Approximately 6:16 PM  I have reviewed the triage vital signs and the nursing notes.   HISTORY  Chief Complaint Back Pain    HPI Chase Brooks is a 60 y.o. male who presents the emergency department for evaluation of low back pain.  Patient has had issues with his back for the past 6 months.  He had Brooks injury in 2002 with a bulging disc and had been largely asymptomatic until 6 months ago.  Patient has had increasing pain, recently was seen by orthopedics.  Patient had Brooks MRI less than a week ago which revealed bulging and torn disc in his lumbar spine.  There was no central cord impingement, and patient is scheduled for epidural injection of steroid in 4 days.  Patient states that he was reviewing MRI results and they were palpating his spine when he started to have a tightening chest increase to frank pain.  There is no concerning neuro deficits with bowel or bladder incontinence, saddle anesthesia or any paresthesias.  Patient is reporting pain to his spine.  Patient is currently taking ibuprofen, Flexeril as needed, gabapentin       Past Medical History:  Diagnosis Date   Nephrolithiasis    Vertigo     Patient Active Problem List   Diagnosis Date Noted   Chronic bilateral low back pain with sciatica 11/15/2020   Neuropathy 11/15/2020   Acute left-sided low back pain without sciatica 11/09/2020   History of colonic polyps    Essential hypertension 10/25/2017   Kidney stone 05/17/2017    Past Surgical History:  Procedure Laterality Date   COLONOSCOPY W/ POLYPECTOMY  03/26/2014   polypectomy x 2, repeat 5 years   COLONOSCOPY WITH PROPOFOL N/A 12/21/2019   Procedure: COLONOSCOPY WITH PROPOFOL;  Surgeon: Lucilla Lame, MD;  Location: [place];  Service: Endoscopy;  Laterality: N/A;    Prior to Admission medications   Medication Sig  Start Date End Date Taking? Authorizing Provider  gabapentin (NEURONTIN) 300 MG capsule Take 1 capsule (300 mg total) by mouth 3 (three) times daily for 10 days. 12/06/20 12/16/20 Yes Careen Mauch, Charline Bills, PA-C  meloxicam (MOBIC) 15 MG tablet Take 1 tablet (15 mg total) by mouth daily. 12/06/20  Yes Lelaina Oatis, Charline Bills, PA-C  methocarbamol (ROBAXIN) 500 MG tablet Take 1 tablet (500 mg total) by mouth 4 (four) times daily. 12/06/20  Yes Cloyd Ragas, Charline Bills, PA-C  oxyCODONE-acetaminophen (PERCOCET/ROXICET) 5-325 MG tablet Take 1 tablet by mouth every 6 (six) hours as needed for severe pain. 12/06/20  Yes Tyrah Broers, Charline Bills, PA-C  predniSONE (DELTASONE) 50 MG tablet Take 1 tablet (50 mg total) by mouth daily with breakfast. 12/06/20  Yes Naomii Kreger, Charline Bills, PA-C  Aspirin-Salicylamide-Caffeine (ARTHRITIS STRENGTH BC POWDER PO) Take by mouth.    [provider]  gabapentin (NEURONTIN) 100 MG capsule Take 1 capsule (100 mg total) by mouth at bedtime. 11/15/20   Vigg, Avanti, MD  ibuprofen (ADVIL) 600 MG tablet Take 600 mg by mouth every 6 (six) hours as needed.    [provider]  methylPREDNISolone (MEDROL DOSEPAK) 4 MG TBPK tablet Use as directed 11/09/20   Vigg, Avanti, MD  Multiple Vitamins-Minerals (CENTRUM SILVER 50+MEN) TABS  11/04/17   [provider]  Omega-3 Fatty Acids (FISH OIL) 1000 MG CAPS Take by mouth.    [provider]    Allergies Patient has no known allergies.  Family History  Problem  Relation Age of Onset   Intracerebral hemorrhage Mother    Cancer Neg Hx    COPD Neg Hx    Diabetes Neg Hx    Heart disease Neg Hx    Hypertension Neg Hx    Stroke Neg Hx     Social History Social History   Tobacco Use   Smoking status: Never   Smokeless tobacco: Never  Vaping Use   Vaping Use: Never used  Substance Use Topics   Alcohol use: No   Drug use: No     Review of Systems  Constitutional: No fever/chills Eyes: No visual  changes. No discharge ENT: No upper respiratory complaints. Cardiovascular: no chest pain. Respiratory: no cough. No SOB. Gastrointestinal: No abdominal pain.  No nausea, no vomiting.  No diarrhea.  No constipation. Genitourinary: Negative for dysuria. No hematuria Musculoskeletal: Low back pain Skin: Negative for rash, abrasions, lacerations, ecchymosis. Neurological: Negative for headaches, focal weakness or numbness.  10 System ROS otherwise negative.  ____________________________________________   PHYSICAL EXAM:  VITAL SIGNS: ED Triage Vitals  Enc Vitals Group     BP 12/06/20 1557 (!) 153/102     Pulse Rate 12/06/20 1557 (!) 120     Resp 12/06/20 1557 20     Temp 12/06/20 1557 99.1 F (37.3 C)     Temp Source 12/06/20 1557 Oral     SpO2 12/06/20 1557 98 %     Weight 12/06/20 1558 195 lb (88.5 kg)     Height 12/06/20 1558 5\' 8"  (1.727 m)     Head Circumference --      Peak Flow --      Pain Score 12/06/20 1613 5     Pain Loc --      Pain Edu? --      Excl. in GC? --      Constitutional: Alert and oriented. Well appearing and in no acute distress. Eyes: Conjunctivae are normal. PERRL. EOMI. Head: Atraumatic. ENT:      Ears:       Nose: No congestion/rhinnorhea.      Mouth/Throat: Mucous membranes are moist.  Neck: No stridor.    Cardiovascular: Normal rate, regular rhythm. Normal S1 and S2.  Good peripheral circulation. Respiratory: Normal respiratory effort without tachypnea or retractions. Lungs CTAB. Good air entry to the bases with no decreased or absent breath sounds. Gastrointestinal: Bowel sounds 4 quadrants. Soft and nontender to palpation. No guarding or rigidity. No palpable masses. No distention. No CVA tenderness. Musculoskeletal: Full range of motion to all extremities. No gross deformities appreciated.  No frank midline tenderness, patient is tender in the bilateral paraspinal muscle groups worse on the left than right.  Appreciable spasms are felt on  the left side paraspinal muscle group between L2 and L5.  Again no midline tenderness, no palpable abnormality or step-off along the spine.  No tenderness into the SI joints bilaterally.  Sensation intact in all dermatomal distributions in the lower extremities.  Pulses intact bilateral lower extremities. Neurologic:  Normal speech and language. No gross focal neurologic deficits are appreciated.  Skin:  Skin is warm, dry and intact. No rash noted. Psychiatric: Mood and affect are normal. Speech and behavior are normal. Patient exhibits appropriate insight and judgement.   ____________________________________________   LABS (all labs ordered are listed, but only abnormal results are displayed)  Labs Reviewed - No data to display ____________________________________________  EKG   ____________________________________________  RADIOLOGY   No results found.   MRI results from 11/29/2020  reviewed today.  L3-L4 disc with bulging and fissures.  No central cord impingement.  Foraminal stenosis noted at multiple levels  CLINICAL DATA:  Low back pain with left leg numbness to foot for 6 months.   EXAM: MRI LUMBAR SPINE WITHOUT CONTRAST   TECHNIQUE: Multiplanar, multisequence MR imaging of the lumbar spine was performed. No intravenous contrast was administered.   COMPARISON:  X-ray lumbar 09/29/2020.   FINDINGS: Segmentation:  Standard.   Alignment: Minimal grade 1 anterolisthesis of L4 on L5. 2 mm retrolisthesis of L5 on S1.   Vertebrae: No acute fracture, evidence of discitis, or aggressive bone lesion.   Conus medullaris and cauda equina: Conus extends to the T12 level. Conus and cauda equina appear normal.   Paraspinal and other soft tissues: No acute paraspinal abnormality.   Disc levels:   Disc spaces: Degenerative disease with disc height loss at L5-S1. Disc desiccation at L3-4 and L4-5.   T12-L1: No significant disc bulge. No neural foraminal stenosis.  No central canal stenosis.   L1-L2: No significant disc bulge. No neural foraminal stenosis. No central canal stenosis.   L2-L3: No significant disc bulge. No neural foraminal stenosis. No central canal stenosis.   L3-L4: Mild broad-based disc bulge with bilateral foraminal annular fissures. Mild bilateral facet arthropathy. Bilateral lateral recess narrowing. Mild bilateral foraminal stenosis.   L4-L5: Broad-based disc bulge. Moderate bilateral facet arthropathy. Mild bilateral foraminal stenosis. No central canal stenosis.   L5-S1: Broad-based disc osteophyte complex. Mild left facet arthropathy. No foraminal stenosis. No central canal stenosis.   IMPRESSION: 1. Lumbar spine spondylosis as described above. 2. No acute osseous injury of the lumbar spine.     Electronically Signed   By: Kathreen Devoid M.D.   On: 11/30/2020 10:21  ____________________________________________    PROCEDURES  Procedure(s) performed:    Procedures    Medications  ketorolac (TORADOL) 30 MG/ML injection 30 mg (has no administration in time range)  morphine 4 MG/ML injection 4 mg (has no administration in time range)  dexamethasone (DECADRON) injection 10 mg (has no administration in time range)  methocarbamol (ROBAXIN) tablet 1,000 mg (has no administration in time range)     ____________________________________________   INITIAL IMPRESSION / ASSESSMENT AND PLAN / ED COURSE  Pertinent labs & imaging results that were available during my care of the patient were reviewed by me and considered in my medical decision making (see chart for details).  Review of the [place] CSRS was performed in accordance of the [place] prior to dispensing any controlled drugs.           Patient's diagnosis is consistent with low back pain, bulging disc.  Patient presents emergency department with increasing back pain.  Patient has had worsening symptoms for the past 6 months, had Brooks MRI this past week.   Patient had evidence of a bulging disc with fissures but no central cord impingement.  Patient has increasing pain today, no concerning neuro deficits.  Patient has appreciable spasms in his lumbar paraspinal muscle groups as described above.  This time I do not see indication to repeat MRI as he has had no trauma, no concerning neuro symptoms and again MRI was less than a week ago.  Patient will be trialed on the control medications.  I will increase his gabapentin, place the patient on anti-inflammatory and muscle relaxer and limited pain medication.  He is scheduled to see his surgeon in 4 days for epidural injections.  Concerning signs and symptoms are discussed at  length with the patient and his wife, this includes worsening pain, inability to walk, urinary or bowel incontinence, saddle anesthesia, paresthesias.  Otherwise follow-up with surgery in 4 days for epidural injection..  Patient is given ED precautions to return to the ED for any worsening or new symptoms.     ____________________________________________  FINAL CLINICAL IMPRESSION(S) / ED DIAGNOSES  Final diagnoses:  Acute left-sided low back pain, unspecified whether sciatica present      NEW MEDICATIONS STARTED DURING THIS VISIT:  ED Discharge Orders          Ordered    methocarbamol (ROBAXIN) 500 MG tablet  4 times daily        12/06/20 1835    oxyCODONE-acetaminophen (PERCOCET/ROXICET) 5-325 MG tablet  Every 6 hours PRN        12/06/20 1835    predniSONE (DELTASONE) 50 MG tablet  Daily with breakfast        12/06/20 1835    meloxicam (MOBIC) 15 MG tablet  Daily        12/06/20 1835    gabapentin (NEURONTIN) 300 MG capsule  3 times daily        12/06/20 1835                This chart was dictated using voice recognition software/Dragon. Despite best efforts to proofread, errors can occur which can change the meaning. Any change was purely unintentional.    Darletta Moll, PA-C 12/06/20 Alveda Reasons, MD 12/07/20 [phone]

## 2020-12-06 NOTE — ED Triage Notes (Signed)
First Nurse Note:  C/O back pain.  Has had ongoing back pain since June.  Plan to have an epidural injection on Monday for pain.  Has had MRI and imaging for pain.  Patient presents to ED for pain control.

## 2020-12-15 ENCOUNTER — Encounter: Payer: Self-pay | Admitting: Internal Medicine

## 2020-12-15 ENCOUNTER — Other Ambulatory Visit: Payer: Self-pay

## 2020-12-15 ENCOUNTER — Ambulatory Visit: Payer: BC Managed Care – PPO | Admitting: Internal Medicine

## 2020-12-15 VITALS — BP 155/80 | HR 91 | Temp 98.7°F | Ht 68.11 in | Wt 194.7 lb

## 2020-12-15 DIAGNOSIS — G8929 Other chronic pain: Secondary | ICD-10-CM

## 2020-12-15 DIAGNOSIS — M5440 Lumbago with sciatica, unspecified side: Secondary | ICD-10-CM

## 2020-12-15 DIAGNOSIS — I10 Essential (primary) hypertension: Secondary | ICD-10-CM

## 2020-12-15 MED ORDER — AMLODIPINE BESYLATE 2.5 MG PO TABS: 2.5000 mg | ORAL_TABLET | Freq: Every day | ORAL | 3 refills | Status: DC

## 2020-12-15 NOTE — Progress Notes (Signed)
BP (!) 155/80   Pulse 91   Temp 98.7 F (37.1 C) (Oral)   Ht 5' 8.11" (1.73 m)   Wt 194 lb 11.2 oz (88.3 kg)   SpO2 97%   BMI 29.51 kg/m    Subjective:    Patient ID: Chase Brooks, male    DOB: 20-Feb-1960, 60 y.o.   MRN: [id number]  Chief Complaint  Patient presents with  . Back Pain    Finally got MRI, seen ortho doctor, has received back injections on Monday     HPI: Chase Brooks is a 60 y.o. male  Pt had to go to the ER sec to a bad back and had a locked back after seeing a physicist . spinal injections on Monday , no bending over / lifting things per ortho, sees them back x 2 weeks for a follow up had an epidural shot  Back Pain This is a chronic problem. The current episode started more than 1 year ago. The problem has been gradually improving since onset. The pain is present in the lumbar spine. The pain is at a severity of 6/10. The pain is mild. Pertinent negatives include no abdominal pain, bladder incontinence, bowel incontinence, chest pain, dysuria, fever, headaches, leg pain, numbness, paresis, paresthesias, pelvic pain, perianal numbness, tingling, weakness or weight loss. The treatment provided mild relief.   Chief Complaint  Patient presents with  . Back Pain    Finally got MRI, seen ortho doctor, has received back injections on Monday     Relevant past medical, surgical, family and social history reviewed and updated as indicated. Interim medical history since our last visit reviewed. Allergies and medications reviewed and updated.  Review of Systems  Constitutional:  Negative for fever and weight loss.  Cardiovascular:  Negative for chest pain.  Gastrointestinal:  Negative for abdominal pain and bowel incontinence.  Genitourinary:  Negative for bladder incontinence, dysuria and pelvic pain.  Musculoskeletal:  Positive for back pain.  Neurological:  Negative for tingling, weakness, numbness, headaches and paresthesias.   Per HPI unless  specifically indicated above     Objective:    BP (!) 155/80   Pulse 91   Temp 98.7 F (37.1 C) (Oral)   Ht 5' 8.11" (1.73 m)   Wt 194 lb 11.2 oz (88.3 kg)   SpO2 97%   BMI 29.51 kg/m   Wt Readings from Last 3 Encounters:  12/15/20 194 lb 11.2 oz (88.3 kg)  12/06/20 195 lb (88.5 kg)  11/15/20 195 lb (88.5 kg)    Physical Exam Vitals and nursing note reviewed.  Constitutional:      General: He is not in acute distress.    Appearance: Normal appearance. He is not ill-appearing or diaphoretic.  HENT:     Head: Normocephalic and atraumatic.     Right Ear: Tympanic membrane and external ear normal. There is no impacted cerumen.     Left Ear: External ear normal.     Nose: No congestion or rhinorrhea.     Mouth/Throat:     Pharynx: No oropharyngeal exudate or posterior oropharyngeal erythema.  Eyes:     Conjunctiva/sclera: Conjunctivae normal.     Pupils: Pupils are equal, round, and reactive to light.  Cardiovascular:     Rate and Rhythm: Normal rate and regular rhythm.     Heart sounds: No murmur heard.   No friction rub. No gallop.  Pulmonary:     Effort: No respiratory distress.     Breath  sounds: No stridor. No wheezing or rhonchi.  Chest:     Chest wall: No tenderness.  Abdominal:     General: Abdomen is flat. Bowel sounds are normal.     Palpations: Abdomen is soft. There is no mass.     Tenderness: There is no abdominal tenderness.  Musculoskeletal:     Cervical back: Normal range of motion and neck supple. No rigidity or tenderness.     Left lower leg: No edema.  Skin:    General: Skin is warm and dry.  Neurological:     Mental Status: He is alert.    Results for orders placed or performed in visit on 11/09/20  CBC with Differential/Platelet  Result Value Ref Range   WBC 6.5 3.4 - 10.8 x10E3/uL   RBC 5.12 4.14 - 5.80 x10E6/uL   Hemoglobin 16.6 13.0 - 17.7 g/dL   Hematocrit 46.5 37.5 - 51.0 %   MCV 91 79 - 97 fL   MCH 32.4 26.6 - 33.0 pg   MCHC 35.7  31.5 - 35.7 g/dL   RDW 12.5 11.6 - 15.4 %   Platelets 237 150 - 450 x10E3/uL   Neutrophils 54 Not Estab. %   Lymphs 34 Not Estab. %   Monocytes 9 Not Estab. %   Eos 2 Not Estab. %   Basos 1 Not Estab. %   Neutrophils Absolute 3.6 1.4 - 7.0 x10E3/uL   Lymphocytes Absolute 2.2 0.7 - 3.1 x10E3/uL   Monocytes Absolute 0.6 0.1 - 0.9 x10E3/uL   EOS (ABSOLUTE) 0.1 0.0 - 0.4 x10E3/uL   Basophils Absolute 0.0 0.0 - 0.2 x10E3/uL   Immature Granulocytes 0 Not Estab. %   Immature Grans (Abs) 0.0 0.0 - 0.1 x10E3/uL  Comprehensive metabolic panel  Result Value Ref Range   Glucose 78 70 - 99 mg/dL   BUN 11 6 - 24 mg/dL   Creatinine, Ser 0.90 0.76 - 1.27 mg/dL   eGFR 99 >59 mL/min/1.73   BUN/Creatinine Ratio 12 9 - 20   Sodium 140 134 - 144 mmol/L   Potassium 4.2 3.5 - 5.2 mmol/L   Chloride 103 96 - 106 mmol/L   CO2 25 20 - 29 mmol/L   Calcium 9.3 8.7 - 10.2 mg/dL   Total Protein 6.9 6.0 - 8.5 g/dL   Albumin 4.5 3.8 - 4.9 g/dL   Globulin, Total 2.4 1.5 - 4.5 g/dL   Albumin/Globulin Ratio 1.9 1.2 - 2.2   Bilirubin Total 0.2 0.0 - 1.2 mg/dL   Alkaline Phosphatase 105 44 - 121 IU/L   AST 21 0 - 40 IU/L   ALT 12 0 - 44 IU/L        Current Outpatient Medications:  .  Aspirin-Salicylamide-Caffeine (ARTHRITIS STRENGTH BC POWDER PO), Take by mouth., Disp: , Rfl:  .  gabapentin (NEURONTIN) 100 MG capsule, Take 1 capsule (100 mg total) by mouth at bedtime., Disp: 30 capsule, Rfl: 1 .  gabapentin (NEURONTIN) 300 MG capsule, Take 1 capsule (300 mg total) by mouth 3 (three) times daily for 10 days., Disp: 30 capsule, Rfl: 0 .  ibuprofen (ADVIL) 600 MG tablet, Take 600 mg by mouth every 6 (six) hours as needed., Disp: , Rfl:  .  meloxicam (MOBIC) 15 MG tablet, Take 1 tablet (15 mg total) by mouth daily., Disp: 30 tablet, Rfl: 0 .  Multiple Vitamins-Minerals (CENTRUM SILVER 50+MEN) TABS, , Disp: , Rfl:  .  Omega-3 Fatty Acids (FISH OIL) 1000 MG CAPS, Take by mouth., Disp: , Rfl:  .  methocarbamol  (ROBAXIN) 500 MG tablet, Take 1 tablet (500 mg total) by mouth 4 (four) times daily. (Patient not taking: Reported on 12/15/2020), Disp: 20 tablet, Rfl: 0 .  oxyCODONE-acetaminophen (PERCOCET/ROXICET) 5-325 MG tablet, Take 1 tablet by mouth every 6 (six) hours as needed for severe pain. (Patient not taking: Reported on 12/15/2020), Disp: 12 tablet, Rfl: 0    Assessment & Plan:  Back pain : is on gabapentin 300 mg tid , mobic once a day , robaxin from the ER - prn.       Back off of on gabapentin 300 mg bid or 600 mg at night.   2. HTN : start pt on  norvasc 2.5 mg Continue current meds.  Medication compliance emphasised. pt advised to keep Bp logs. Pt verbalised understanding of the same. Pt to have a low salt diet . Exercise to reach a goal of at least 150 mins a week.  lifestyle modifications explained and pt understands importance of the above.  Problem List Items Addressed This Visit   None    No orders of the defined types were placed in this encounter.    Meds ordered this encounter  Medications  . amLODipine (NORVASC) 2.5 MG tablet    Sig: Take 1 tablet (2.5 mg total) by mouth daily.    Dispense:  30 tablet    Refill:  3     Follow up plan: No follow-ups on file.

## 2021-02-03 ENCOUNTER — Encounter: Payer: Self-pay | Admitting: Internal Medicine

## 2021-02-17 ENCOUNTER — Telehealth: Payer: Self-pay | Admitting: Internal Medicine

## 2021-02-17 NOTE — Telephone Encounter (Signed)
Copied from [place] [id number]. Topic: General - Other >> Feb 17, 2021 10:22 AM McGill, Nelva Bush wrote: Reason for CRM: Lolita Patella from [facility] requesting lab results to be faxed specifically SED rate and (CRP).  Fax- [phone]

## 2021-02-17 NOTE — Telephone Encounter (Signed)
Called patient and informed him that we have not drawn those two labs (Sed rate and CRP)  for him here. Patient stated that he already had them drawn at [facility].

## 2021-02-20 ENCOUNTER — Other Ambulatory Visit: Payer: Self-pay

## 2021-02-20 ENCOUNTER — Encounter: Payer: Self-pay | Admitting: Internal Medicine

## 2021-02-20 ENCOUNTER — Ambulatory Visit: Payer: BC Managed Care – PPO | Admitting: Internal Medicine

## 2021-02-20 VITALS — BP 137/86 | HR 91 | Temp 98.8°F | Ht 68.11 in | Wt 190.4 lb

## 2021-02-20 DIAGNOSIS — G8929 Other chronic pain: Secondary | ICD-10-CM | POA: Diagnosis not present

## 2021-02-20 DIAGNOSIS — M5440 Lumbago with sciatica, unspecified side: Secondary | ICD-10-CM

## 2021-02-20 DIAGNOSIS — I10 Essential (primary) hypertension: Secondary | ICD-10-CM | POA: Diagnosis not present

## 2021-02-20 LAB — URINALYSIS, ROUTINE W REFLEX MICROSCOPIC
Appearance Ur: NEGATIVE
Bilirubin, UA: NEGATIVE
Color, UA: NEGATIVE
Glucose, UA: NEGATIVE
Ketones, UA: NEGATIVE
Leukocytes,UA: NEGATIVE
Nitrite, UA: NEGATIVE
Protein,UA: NEGATIVE
Specific Gravity, UA: >1.03 — ABNORMAL HIGH (ref 1.005–1.030)
Urobilinogen, Ur: 0.2 mg/dL (ref 0.2–1.0)
pH, UA: 5.5 (ref 5.0–7.5)

## 2021-02-20 LAB — MICROSCOPIC EXAMINATION
Bacteria, UA: NONE SEEN
Epithelial Cells (non renal): NONE SEEN /hpf (ref 0–10)
WBC, UA: NONE SEEN /hpf (ref 0–5)

## 2021-02-20 NOTE — Progress Notes (Signed)
BP 137/86    Pulse 91    Temp 98.8 F (37.1 C) (Oral)    Ht 5' 8.11" (1.73 m)    Wt 190 lb 6.4 oz (86.4 kg)    SpO2 97%    BMI 28.86 kg/m    Subjective:    Patient ID: Chase Brooks, male    DOB: 28-Nov-1960, 61 y.o.   MRN: [id number]  Chief Complaint  Patient presents with   Hypertension   Back Pain    HPI: Chase Brooks is a 61 y.o. male  Hypertension This is a chronic problem. The current episode started more than 1 year ago. The problem has been waxing and waning since onset. The problem is controlled. Pertinent negatives include no anxiety, blurred vision, chest pain, headaches, malaise/fatigue, neck pain, orthopnea, palpitations, peripheral edema, PND or shortness of breath.  Back Pain This is a chronic problem. The problem has been gradually worsening since onset. The pain is present in the lumbar spine. The quality of the pain is described as shooting. The pain is at a severity of 6/10. The pain is moderate. Associated symptoms include tingling. Pertinent negatives include no abdominal pain, bladder incontinence, bowel incontinence, chest pain, dysuria, fever, headaches, leg pain, numbness, paresis, paresthesias, pelvic pain, perianal numbness or weakness.   Chief Complaint  Patient presents with   Hypertension   Back Pain    Relevant past medical, surgical, family and social history reviewed and updated as indicated. Interim medical history since our last visit reviewed. Allergies and medications reviewed and updated.  Review of Systems  Constitutional:  Negative for fever and malaise/fatigue.  Eyes:  Negative for blurred vision.  Respiratory:  Negative for shortness of breath.   Cardiovascular:  Negative for chest pain, palpitations, orthopnea and PND.  Gastrointestinal:  Negative for abdominal pain and bowel incontinence.  Genitourinary:  Negative for bladder incontinence, dysuria and pelvic pain.  Musculoskeletal:  Positive for back pain. Negative for neck  pain.  Neurological:  Positive for tingling. Negative for weakness, numbness, headaches and paresthesias.   Per HPI unless specifically indicated above     Objective:    BP 137/86    Pulse 91    Temp 98.8 F (37.1 C) (Oral)    Ht 5' 8.11" (1.73 m)    Wt 190 lb 6.4 oz (86.4 kg)    SpO2 97%    BMI 28.86 kg/m   Wt Readings from Last 3 Encounters:  02/20/21 190 lb 6.4 oz (86.4 kg)  12/15/20 194 lb 11.2 oz (88.3 kg)  12/06/20 195 lb (88.5 kg)    Physical Exam Vitals and nursing note reviewed.  Constitutional:      General: He is not in acute distress.    Appearance: Normal appearance. He is not ill-appearing or diaphoretic.  Cardiovascular:     Rate and Rhythm: Normal rate and regular rhythm.     Heart sounds: No murmur heard.   No friction rub. No gallop.  Pulmonary:     Effort: No respiratory distress.     Breath sounds: No stridor. No wheezing or rhonchi.  Chest:     Chest wall: No tenderness.  Musculoskeletal:        General: No swelling, tenderness, deformity or signs of injury.     Cervical back: No rigidity or tenderness.     Right lower leg: No edema.     Left lower leg: No edema.  Skin:    General: Skin is warm and dry.  Neurological:  Mental Status: He is alert.  Psychiatric:        Mood and Affect: Mood normal.        Behavior: Behavior normal.    Results for orders placed or performed in visit on 02/20/21  Microscopic Examination   Urine  Result Value Ref Range   WBC, UA None seen 0 - 5 /hpf   RBC 0-2 0 - 2 /hpf   Epithelial Cells (non renal) None seen 0 - 10 /hpf   Mucus, UA Present (A) Not Estab.   Bacteria, UA None seen None seen/Few  CBC with Differential/Platelet  Result Value Ref Range   WBC 8.2 3.4 - 10.8 x10E3/uL   RBC 5.06 4.14 - 5.80 x10E6/uL   Hemoglobin 16.3 13.0 - 17.7 g/dL   Hematocrit 47.9 37.5 - 51.0 %   MCV 95 79 - 97 fL   MCH 32.2 26.6 - 33.0 pg   MCHC 34.0 31.5 - 35.7 g/dL   RDW 12.7 11.6 - 15.4 %   Platelets 251 150 - 450  x10E3/uL   Neutrophils 64 Not Estab. %   Lymphs 25 Not Estab. %   Monocytes 8 Not Estab. %   Eos 2 Not Estab. %   Basos 1 Not Estab. %   Neutrophils Absolute 5.2 1.4 - 7.0 x10E3/uL   Lymphocytes Absolute 2.1 0.7 - 3.1 x10E3/uL   Monocytes Absolute 0.7 0.1 - 0.9 x10E3/uL   EOS (ABSOLUTE) 0.2 0.0 - 0.4 x10E3/uL   Basophils Absolute 0.0 0.0 - 0.2 x10E3/uL   Immature Granulocytes 0 Not Estab. %   Immature Grans (Abs) 0.0 0.0 - 0.1 x10E3/uL  Comprehensive metabolic panel  Result Value Ref Range   Glucose 83 70 - 99 mg/dL   BUN 15 6 - 24 mg/dL   Creatinine, Ser 0.92 0.76 - 1.27 mg/dL   eGFR 96 >59 mL/min/1.73   BUN/Creatinine Ratio 16 9 - 20   Sodium 139 134 - 144 mmol/L   Potassium 4.2 3.5 - 5.2 mmol/L   Chloride 101 96 - 106 mmol/L   CO2 26 20 - 29 mmol/L   Calcium 9.6 8.7 - 10.2 mg/dL   Total Protein 7.1 6.0 - 8.5 g/dL   Albumin 4.5 3.8 - 4.9 g/dL   Globulin, Total 2.6 1.5 - 4.5 g/dL   Albumin/Globulin Ratio 1.7 1.2 - 2.2   Bilirubin Total 0.3 0.0 - 1.2 mg/dL   Alkaline Phosphatase 104 44 - 121 IU/L   AST 22 0 - 40 IU/L   ALT 19 0 - 44 IU/L  PSA  Result Value Ref Range   Prostate Specific Ag, Serum 0.8 0.0 - 4.0 ng/mL  TSH  Result Value Ref Range   TSH 2.720 0.450 - 4.500 uIU/mL  Urinalysis, Routine w reflex microscopic  Result Value Ref Range   Specific Gravity, UA >1.030 (H) 1.005 - 1.030   pH, UA 5.5 5.0 - 7.5   Color, UA Negative Yellow   Appearance Ur Negative Clear   Leukocytes,UA Negative Negative   Protein,UA Negative Negative/Trace   Glucose, UA Negative Negative   Ketones, UA Negative Negative   RBC, UA Trace (A) Negative   Bilirubin, UA Negative Negative   Urobilinogen, Ur 0.2 0.2 - 1.0 mg/dL   Nitrite, UA Negative Negative   Microscopic Examination See below:   Lipid panel  Result Value Ref Range   Cholesterol, Total 223 (H) 100 - 199 mg/dL   Triglycerides 165 (H) 0 - 149 mg/dL   HDL 43 >39 mg/dL  VLDL Cholesterol Cal 30 5 - 40 mg/dL   LDL Chol  Calc (NIH) 150 (H) 0 - 99 mg/dL   Chol/HDL Ratio 5.2 (H) 0.0 - 5.0 ratio        Current Outpatient Medications:    amLODipine (NORVASC) 2.5 MG tablet, Take 1 tablet (2.5 mg total) by mouth daily., Disp: 30 tablet, Rfl: 3   Multiple Vitamins-Minerals (CENTRUM SILVER 50+MEN) TABS, , Disp: , Rfl:    baclofen (LIORESAL) 10 MG tablet, Take 5-10 mg by mouth 2 (two) times daily as needed., Disp: , Rfl:    gabapentin (NEURONTIN) 300 MG capsule, Take 1 capsule (300 mg total) by mouth 3 (three) times daily for 10 days., Disp: 30 capsule, Rfl: 0    Assessment & Plan:  Chronic back pain PT started recently - failed multiple procedures -  dry needling, laser therapy , thumper, ice , and a TENS units. Will refer to ortho    HTN : start pt on  norvasc 2.5 mg Continue current meds.  Medication compliance emphasised. pt advised to keep Bp logs. Pt verbalised understanding of the same. Pt to have a low salt diet . Exercise to reach a goal of at least 150 mins a week.  lifestyle modifications explained and pt understands importance of the above.     Problem List Items Addressed This Visit       Cardiovascular and Mediastinum   Essential hypertension   Relevant Orders   CBC with Differential/Platelet (Completed)   Comprehensive metabolic panel (Completed)   Lipid panel   PSA (Completed)   TSH (Completed)   Urinalysis, Routine w reflex microscopic (Completed)     Nervous and Auditory   Chronic low back pain with sciatica - Primary   Relevant Medications   baclofen (LIORESAL) 10 MG tablet   Other Relevant Orders   Ambulatory referral to Orthopedics   CBC with Differential/Platelet (Completed)   Comprehensive metabolic panel (Completed)   Lipid panel   PSA (Completed)   TSH (Completed)   Urinalysis, Routine w reflex microscopic (Completed)     Orders Placed This Encounter  Procedures   Microscopic Examination   CBC with Differential/Platelet   Comprehensive metabolic panel   Lipid  panel   PSA   TSH   Urinalysis, Routine w reflex microscopic   Lipid panel   Ambulatory referral to Orthopedics     No orders of the defined types were placed in this encounter.    Follow up plan: Return in about 6 months (around 08/20/2021).

## 2021-02-21 LAB — COMPREHENSIVE METABOLIC PANEL
ALT: 19 IU/L (ref 0–44)
AST: 22 IU/L (ref 0–40)
Albumin/Globulin Ratio: 1.7 (ref 1.2–2.2)
Albumin: 4.5 g/dL (ref 3.8–4.9)
Alkaline Phosphatase: 104 IU/L (ref 44–121)
BUN/Creatinine Ratio: 16 (ref 9–20)
BUN: 15 mg/dL (ref 6–24)
Bilirubin Total: 0.3 mg/dL (ref 0.0–1.2)
CO2: 26 mmol/L (ref 20–29)
Calcium: 9.6 mg/dL (ref 8.7–10.2)
Chloride: 101 mmol/L (ref 96–106)
Creatinine, Ser: 0.92 mg/dL (ref 0.76–1.27)
Globulin, Total: 2.6 g/dL (ref 1.5–4.5)
Glucose: 83 mg/dL (ref 70–99)
Potassium: 4.2 mmol/L (ref 3.5–5.2)
Sodium: 139 mmol/L (ref 134–144)
Total Protein: 7.1 g/dL (ref 6.0–8.5)
eGFR: 96 mL/min/{1.73_m2} (ref >59)

## 2021-02-21 LAB — CBC WITH DIFFERENTIAL/PLATELET
Basophils Absolute: 0 10*3/uL (ref 0.0–0.2)
Basos: 1 %
EOS (ABSOLUTE): 0.2 10*3/uL (ref 0.0–0.4)
Eos: 2 %
Hematocrit: 47.9 % (ref 37.5–51.0)
Hemoglobin: 16.3 g/dL (ref 13.0–17.7)
Immature Grans (Abs): 0 10*3/uL (ref 0.0–0.1)
Immature Granulocytes: 0 %
Lymphocytes Absolute: 2.1 10*3/uL (ref 0.7–3.1)
Lymphs: 25 %
MCH: 32.2 pg (ref 26.6–33.0)
MCHC: 34 g/dL (ref 31.5–35.7)
MCV: 95 fL (ref 79–97)
Monocytes Absolute: 0.7 10*3/uL (ref 0.1–0.9)
Monocytes: 8 %
Neutrophils Absolute: 5.2 10*3/uL (ref 1.4–7.0)
Neutrophils: 64 %
Platelets: 251 10*3/uL (ref 150–450)
RBC: 5.06 x10E6/uL (ref 4.14–5.80)
RDW: 12.7 % (ref 11.6–15.4)
WBC: 8.2 10*3/uL (ref 3.4–10.8)

## 2021-02-21 LAB — LIPID PANEL
Chol/HDL Ratio: 5.2 ratio — ABNORMAL HIGH (ref 0.0–5.0)
Cholesterol, Total: 223 mg/dL — ABNORMAL HIGH (ref 100–199)
HDL: 43 mg/dL (ref >39)
LDL Chol Calc (NIH): 150 mg/dL — ABNORMAL HIGH (ref 0–99)
Triglycerides: 165 mg/dL — ABNORMAL HIGH (ref 0–149)
VLDL Cholesterol Cal: 30 mg/dL (ref 5–40)

## 2021-02-21 LAB — PSA: Prostate Specific Ag, Serum: 0.8 ng/mL (ref 0.0–4.0)

## 2021-02-21 LAB — TSH: TSH: 2.72 u[IU]/mL (ref 0.450–4.500)

## 2021-03-31 ENCOUNTER — Other Ambulatory Visit: Payer: Self-pay | Admitting: Neurosurgery

## 2021-03-31 DIAGNOSIS — M6281 Muscle weakness (generalized): Secondary | ICD-10-CM

## 2021-03-31 DIAGNOSIS — M545 Low back pain, unspecified: Secondary | ICD-10-CM

## 2021-04-10 ENCOUNTER — Encounter: Payer: Self-pay | Admitting: Internal Medicine

## 2021-04-10 ENCOUNTER — Other Ambulatory Visit: Payer: Self-pay | Admitting: Internal Medicine

## 2021-04-11 ENCOUNTER — Ambulatory Visit
Admission: RE | Admit: 2021-04-11 | Discharge: 2021-04-11 | Disposition: A | Discharge status: Home / Self Care | Payer: BC Managed Care – PPO | Source: Ambulatory Visit | Attending: Neurosurgery | Admitting: Neurosurgery

## 2021-04-11 ENCOUNTER — Other Ambulatory Visit: Payer: Self-pay

## 2021-04-11 DIAGNOSIS — M6281 Muscle weakness (generalized): Secondary | ICD-10-CM | POA: Insufficient documentation

## 2021-04-11 DIAGNOSIS — G8929 Other chronic pain: Secondary | ICD-10-CM | POA: Insufficient documentation

## 2021-04-11 DIAGNOSIS — M545 Low back pain, unspecified: Secondary | ICD-10-CM | POA: Diagnosis not present

## 2021-04-11 IMAGING — MR MR CERVICAL SPINE W/O CM
5 series · 40 of 48 positions shown · non-contrast
Comparison: None.

CLINICAL DATA: Chronic low back pain. Great toe numbness. Muscle
weakness.

EXAM:
MRI CERVICAL SPINE WITHOUT CONTRAST
TECHNIQUE: Multiplanar, multisequence MR imaging of the cervical spine was
performed. No intravenous contrast was administered.

[Series 1: T2 · sagittal · 3.0mm · 0.62mm/px · 7 of 15 slices shown (1 of 2)]
[im 1/15]
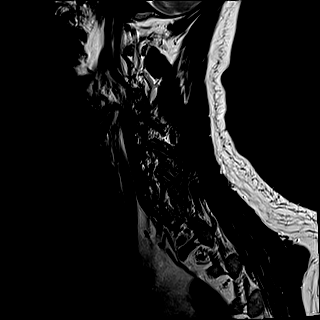
[im 3/15]
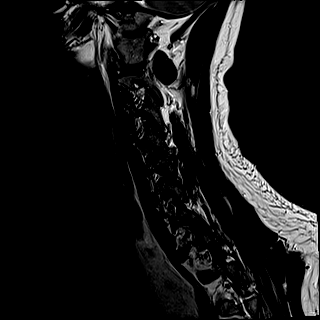
[im 5/15]
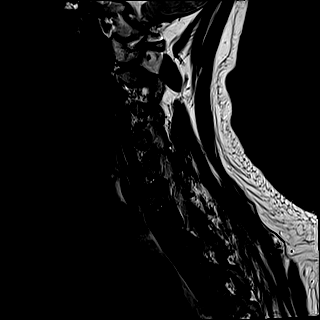
[im 8/15]
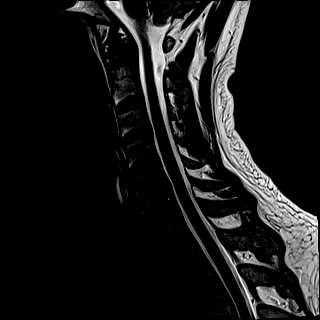
[im 10/15]
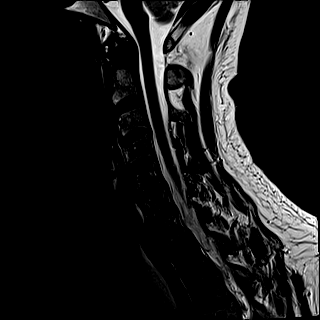
[im 12/15]
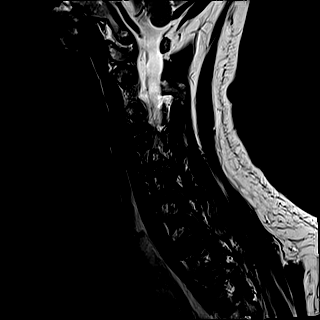
[im 15/15]
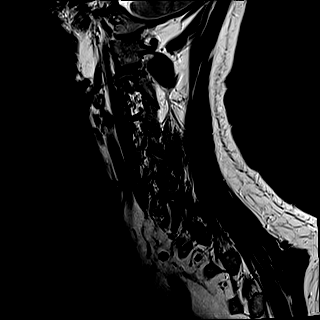

[Series 2: FLAIR · sagittal · 3.0mm · 0.78mm/px · 7 of 15 slices shown]
[im 1/15]
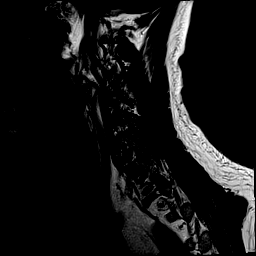
[im 3/15]
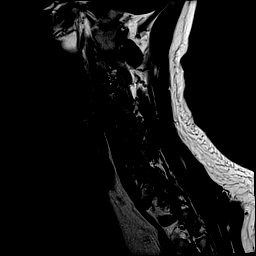
[im 5/15]
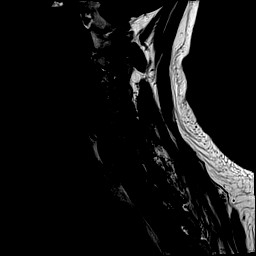
[im 8/15]
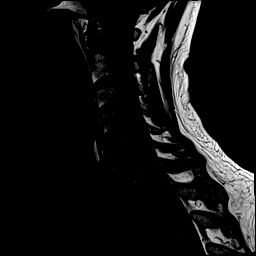
[im 10/15]
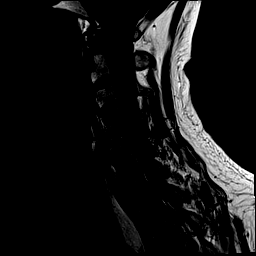
[im 12/15]
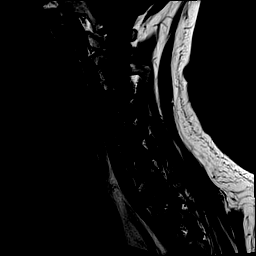
[im 15/15]
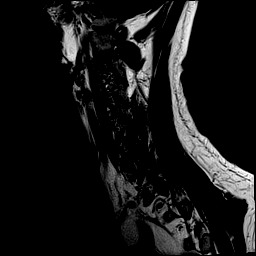

[Series 3: STIR · sagittal · 3.0mm · 0.62mm/px · 6 of 15 slices shown]
[im 1/15]
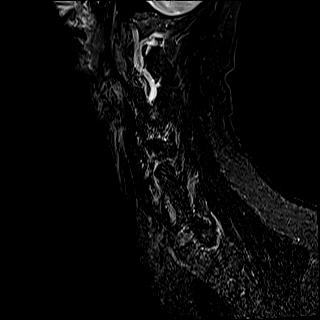
[im 3/15]
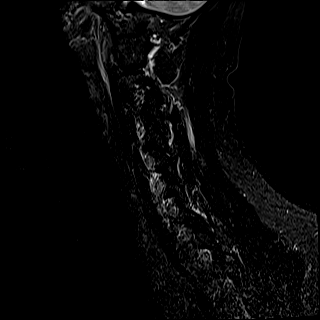
[im 6/15]
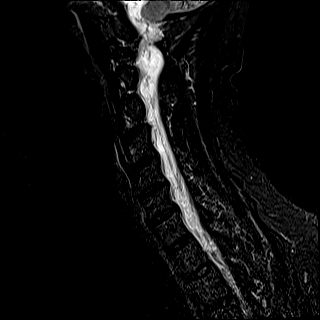
[im 9/15]
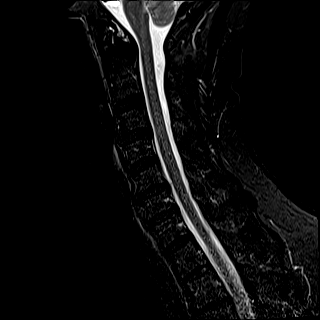
[im 12/15]
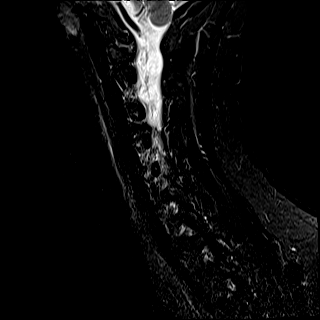
[im 15/15]
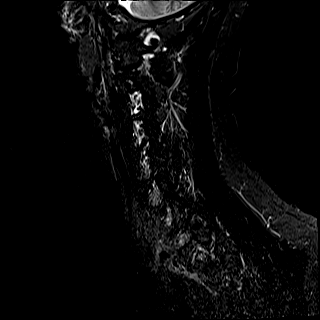

[Series 4: T2 · axial · 3.0mm · 0.70mm/px · z∈[-126,-20]mm · 12 of 33 slices shown (2 of 2)]
[im 1/33]
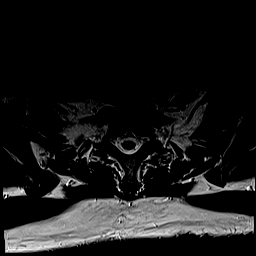
[im 3/33]
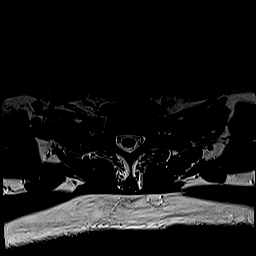
[im 5/33]
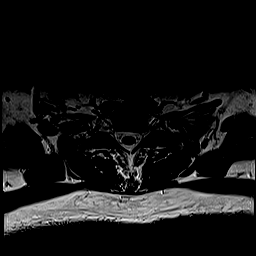
[im 8/33]
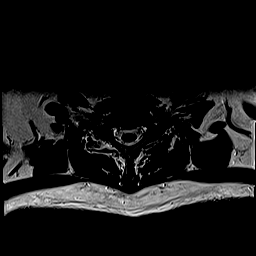
[im 10/33]
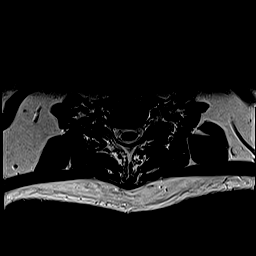
[im 13/33]
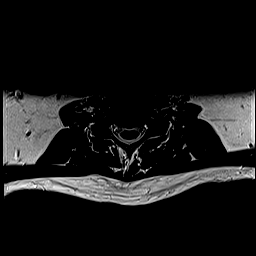
[im 15/33]
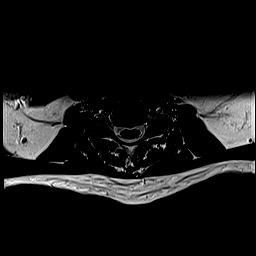
[im 18/33]
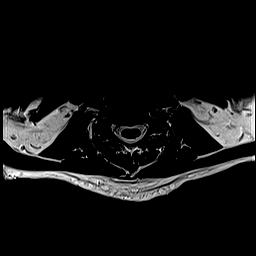
[im 20/33]
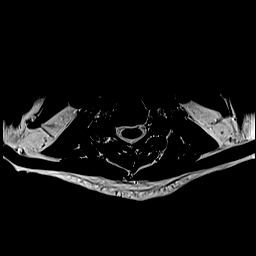
[im 23/33]
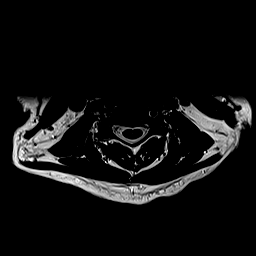
[im 28/33]
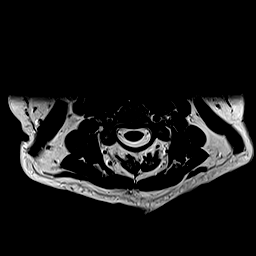
[im 33/33]
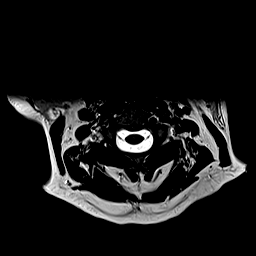

[Series 5: ax mpgr · axial · 3.0mm · 0.35mm/px · z∈[-126,-20]mm · 8 of 33 slices shown]
[im 1/33]
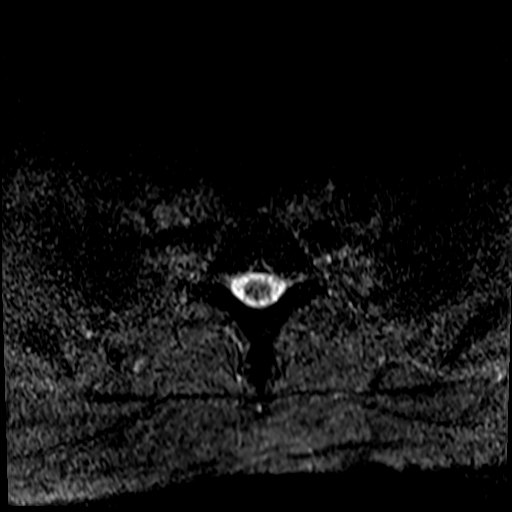
[im 5/33]
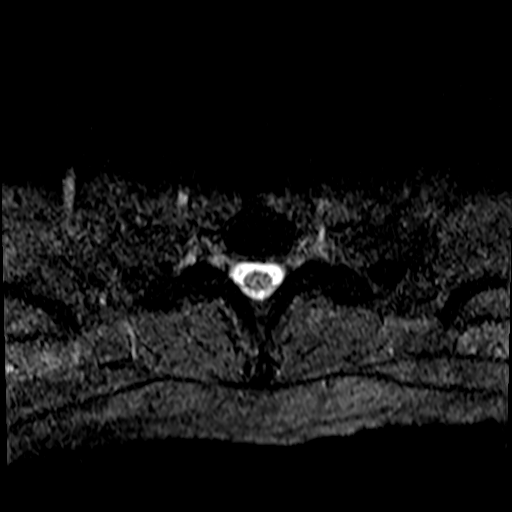
[im 10/33]
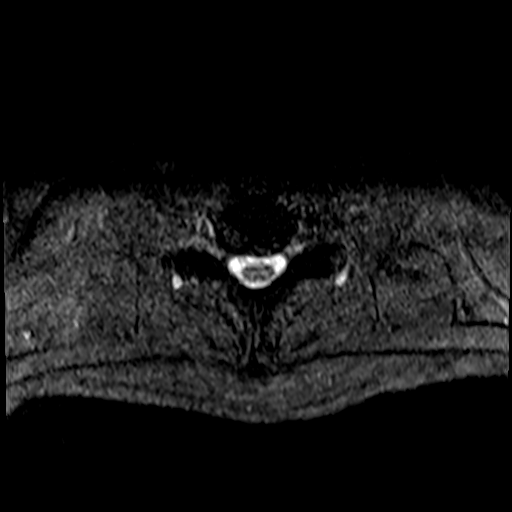
[im 15/33]
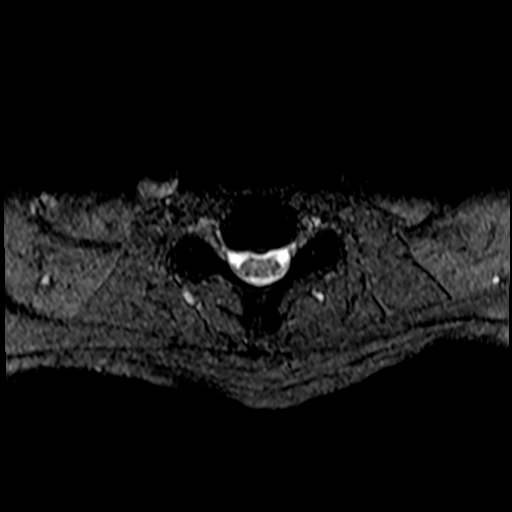
[im 18/33]
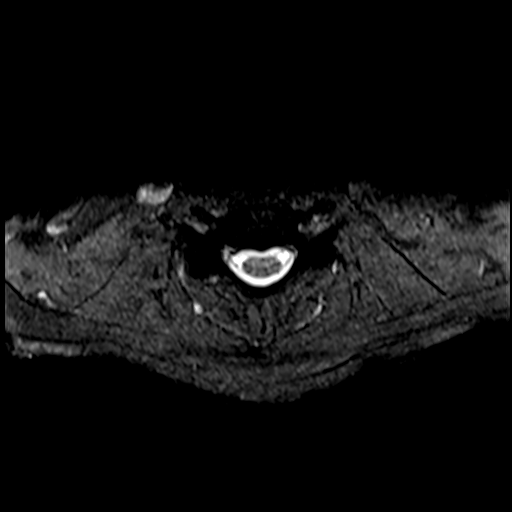
[im 23/33]
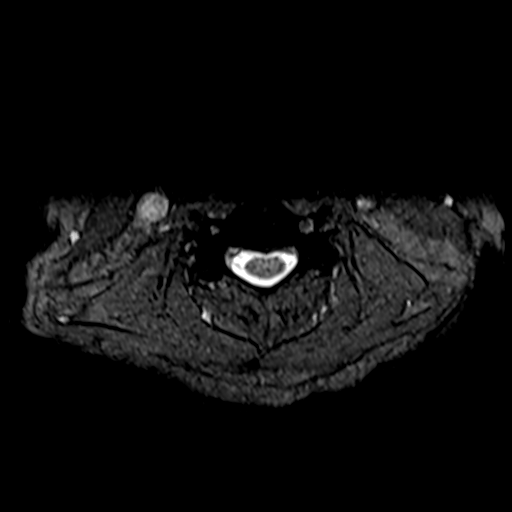
[im 28/33]
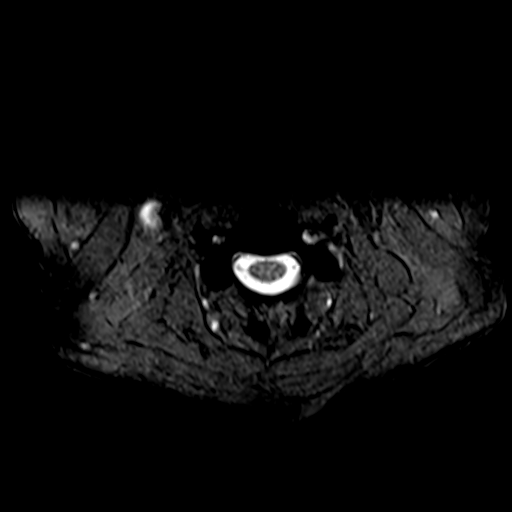
[im 33/33]
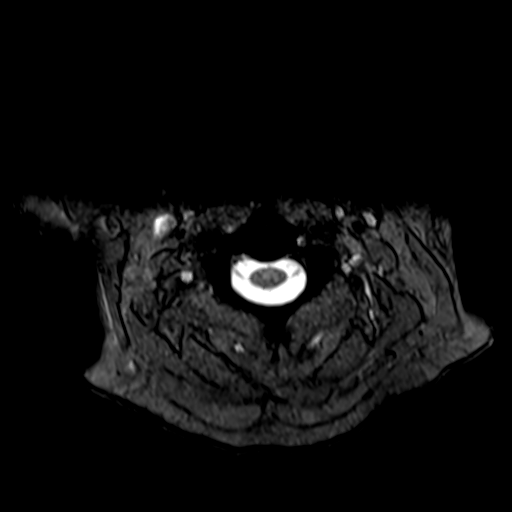

[40 of 48 positions shown; findings below may reference images not displayed]

FINDINGS: Alignment: Normal.

Vertebrae: No fracture, suspicious marrow lesion, or significant
marrow edema.

Cord: Normal signal and morphology.

Posterior Fossa, vertebral arteries, paraspinal tissues:
Unremarkable.

Disc levels:

C2-3: Negative.

C3-4: Mild disc bulging, left greater than right uncovertebral
spurring, and mild facet arthrosis result in moderate left neural
foraminal stenosis without spinal stenosis.

C4-5: Asymmetric right uncovertebral spurring and severe right facet
arthrosis result in moderate right neural foraminal stenosis without
spinal stenosis.

C5-6: Right greater than left uncovertebral spurring and mild facet
arthrosis and a small central disc protrusion without significant
stenosis.

C6-7: Disc bulging, uncovertebral spurring, and mild-to-moderate
left facet arthrosis without significant stenosis.

C7-T1: Moderate facet arthrosis without stenosis.
IMPRESSION: 1. Multilevel cervical disc and facet degeneration without spinal
stenosis.
2. Moderate neural foraminal stenosis on the left at C3-4 and on the
right at C4-5.

## 2021-04-11 IMAGING — MR MR THORACIC SPINE W/O CM
6 series · 29 of 48 positions shown · non-contrast
Comparison: None.

CLINICAL DATA: Chronic low back pain. Great toe numbness. Muscle
weakness.

EXAM:
MRI THORACIC SPINE WITHOUT CONTRAST
TECHNIQUE: Multiplanar, multisequence MR imaging of the thoracic spine was
performed. No intravenous contrast was administered.

[Series 18: T1 · sagittal · 6.0mm · 1.41mm/px · 2 of 9 slices shown (1 of 2)]
[im 1/9]
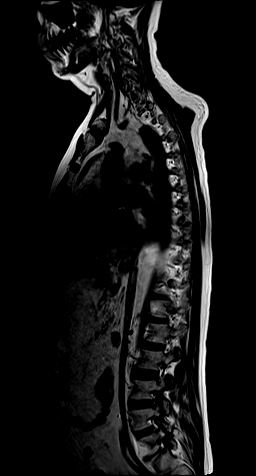
[im 9/9]
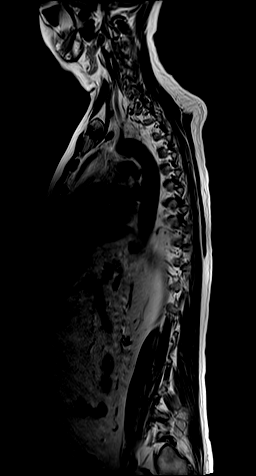

[Series 19: T2 · sagittal · 3.0mm · 1.06mm/px · 6 of 17 slices shown (1 of 2)]
[im 1/17]
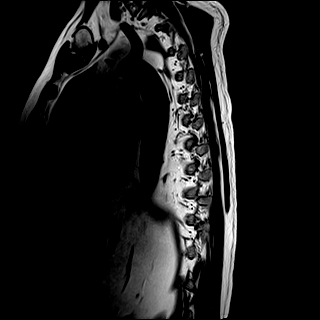
[im 4/17]
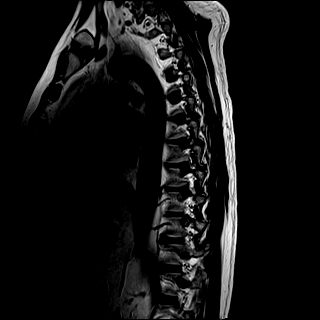
[im 7/17]
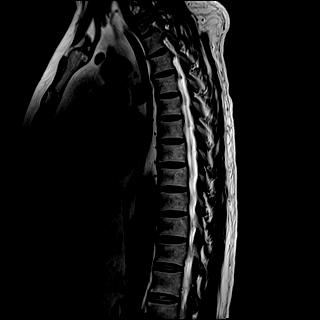
[im 10/17]
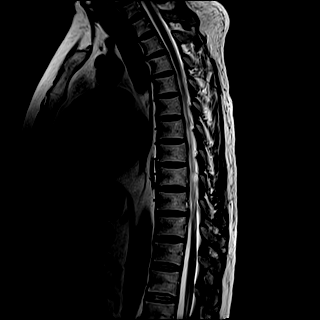
[im 13/17]
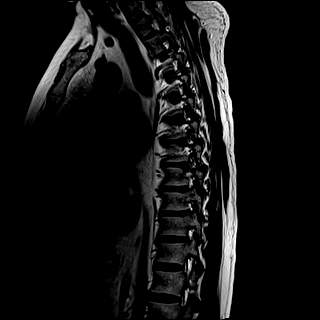
[im 17/17]
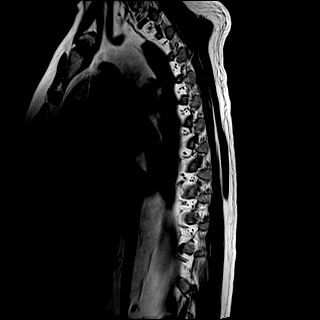

[Series 20: T1 · sagittal · 3.0mm · 1.06mm/px · 6 of 17 slices shown (2 of 2)]
[im 1/17]
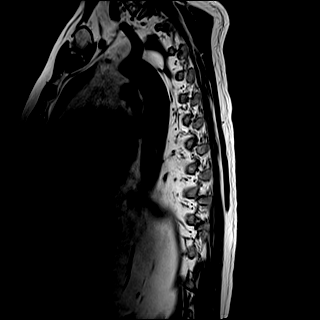
[im 4/17]
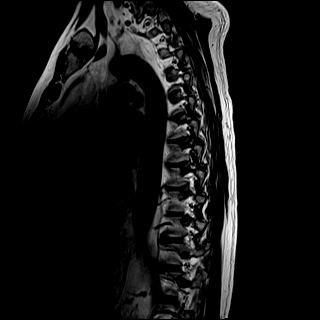
[im 7/17]
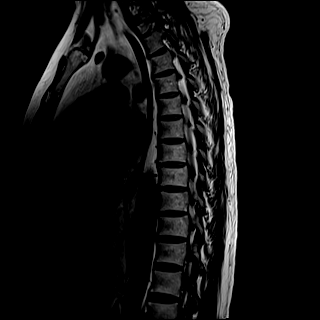
[im 10/17]
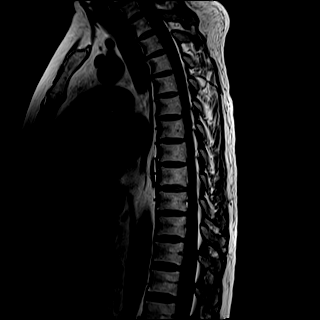
[im 13/17]
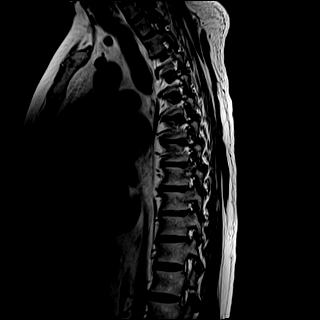
[im 17/17]
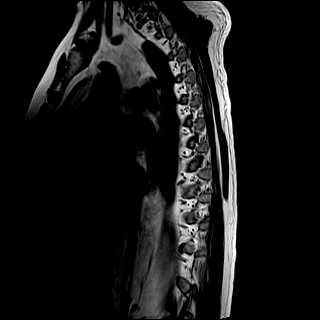

[Series 21: STIR · sagittal · 3.0mm · 0.53mm/px · 6 of 17 slices shown]
[im 1/17]
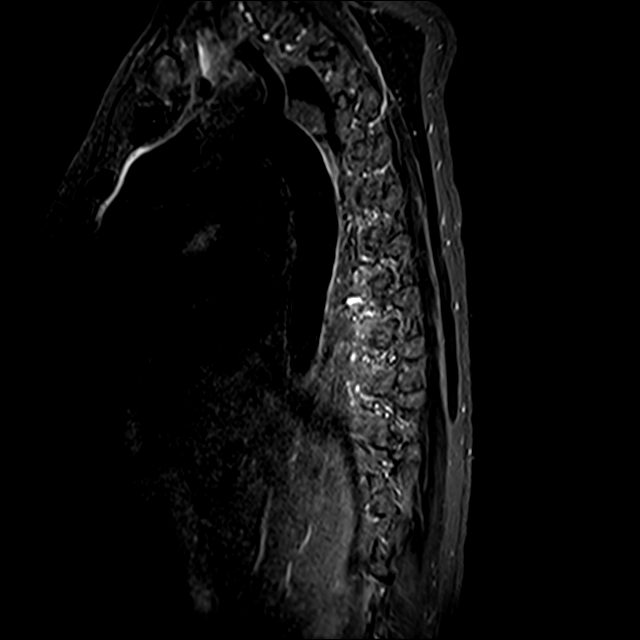
[im 4/17]
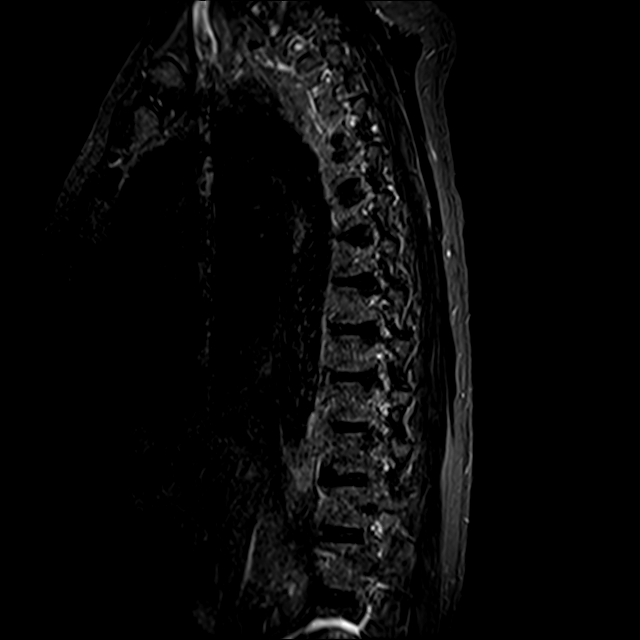
[im 7/17]
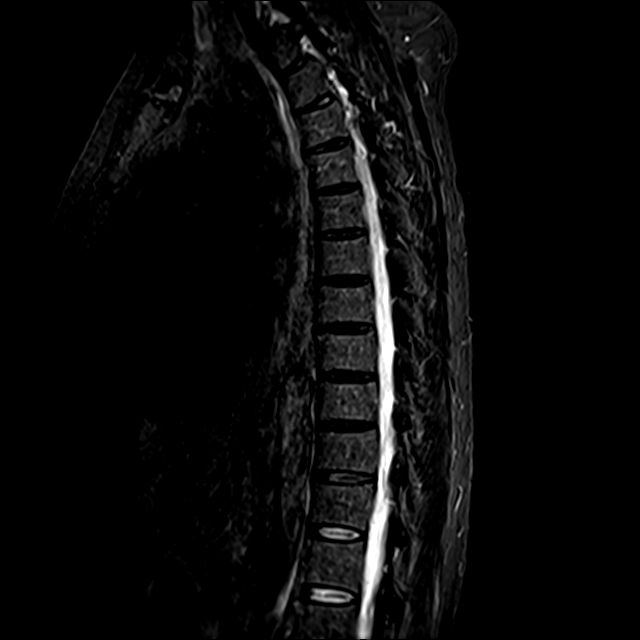
[im 10/17]
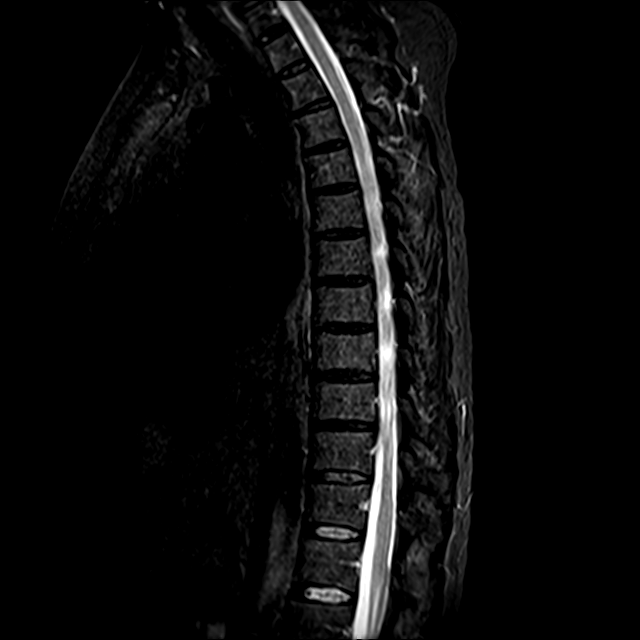
[im 13/17]
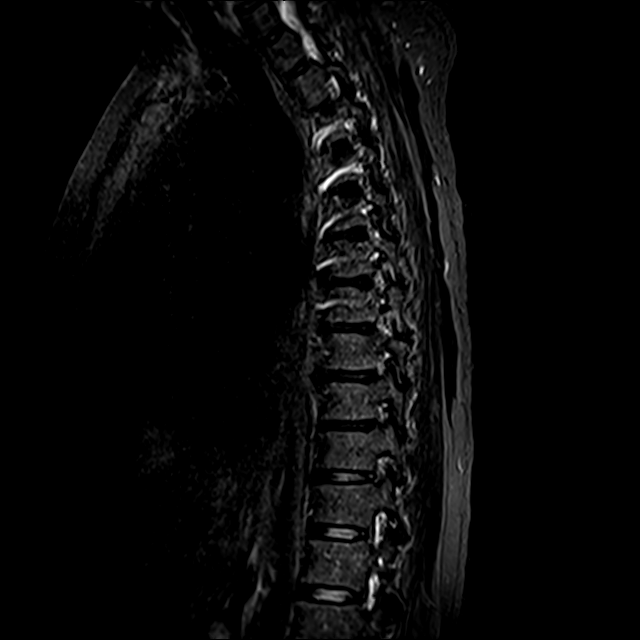
[im 17/17]
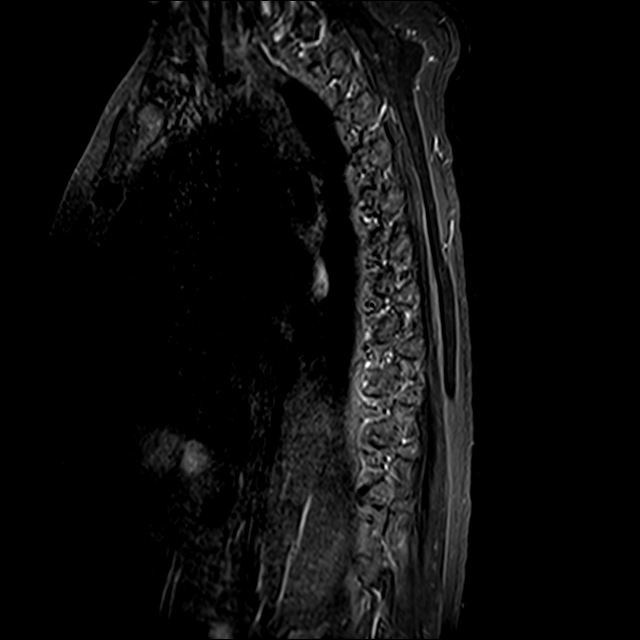

[Series 22: T2 · axial · 4.0mm · 0.59mm/px · z∈[-378,-135]mm · 8 of 39 slices shown (2 of 2)]
[im 1/39]
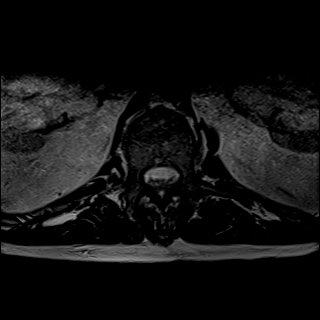
[im 6/39]
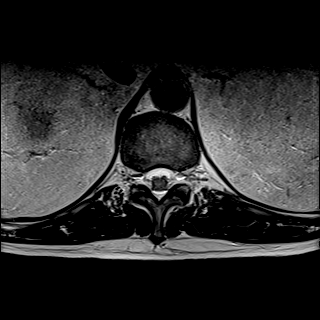
[im 12/39]
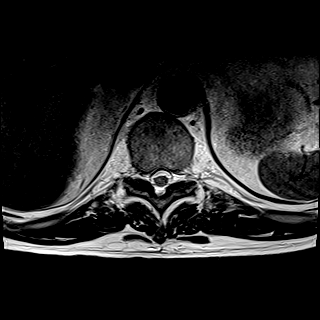
[im 18/39]
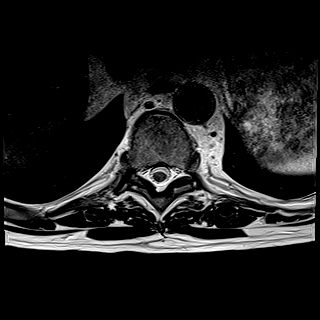
[im 21/39]
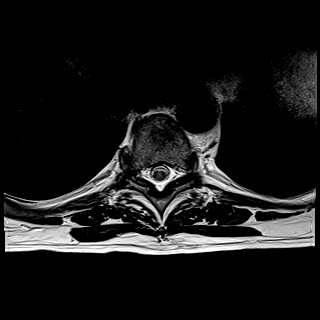
[im 27/39]
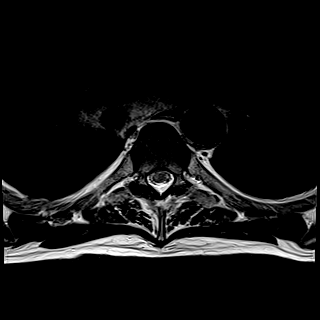
[im 33/39]
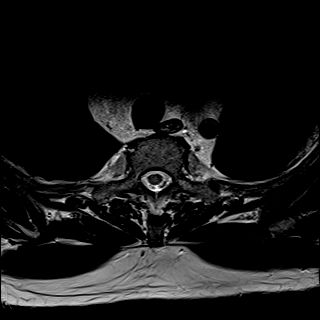
[im 39/39]
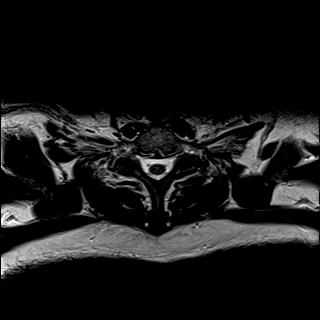

[Series 23: GRE · axial · 4.0mm · 0.37mm/px · 1 of 39 slices shown]
[im 1/39]
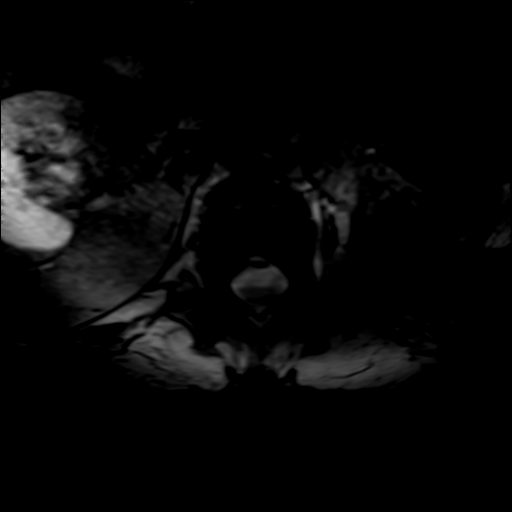

[29 of 48 positions shown; findings below may reference images not displayed]

FINDINGS: Alignment:  Normal.

Vertebrae: No fracture, suspicious marrow lesion, or significant
marrow edema.

Cord:  Normal signal and morphology.

Paraspinal and other soft tissues: Unremarkable.

Disc levels:

Tiny left central disc protrusion at T5-6 and tiny central disc
protrusion at T8-9 without spinal cord mass effect or stenosis.
Small to moderate-sized right posterolateral disc protrusion at
T9-10 with right lateral recess stenosis. Severe facet arthrosis in
the mid and lower thoracic spine with resultant mild to moderate
neural foraminal stenosis at T5-6, T6-7, and T7-8.
IMPRESSION: 1. Disc protrusion at T9-10 with right lateral recess stenosis.
2. Tiny disc protrusions at T5-6 and T8-9 without stenosis.
3. Severe mid and lower thoracic facet arthrosis with
mild-to-moderate multilevel neural foraminal stenosis.

## 2021-04-12 NOTE — Telephone Encounter (Signed)
Requested Prescriptions  ?Pending Prescriptions Disp Refills  ?? amLODipine (NORVASC) 2.5 MG tablet [Pharmacy Med Name: AMLODIPINE BESYLATE 2.5 MG TAB] 90 tablet 1  ?  Sig: TAKE 1 TABLET BY MOUTH EVERY DAY  ?  ? Cardiovascular: Calcium Channel Blockers 2 Passed - 04/10/2021  2:23 PM  ?  ?  Passed - Last BP in normal range  ?  BP Readings from Last 1 Encounters:  ?02/20/21 137/86  ?   ?  ?  Passed - Last Heart Rate in normal range  ?  Pulse Readings from Last 1 Encounters:  ?02/20/21 91  ?   ?  ?  Passed - Valid encounter within last 6 months  ?  Recent Outpatient Visits   ?      ? 1 month ago Chronic low back pain with sciatica, sciatica laterality unspecified, unspecified back pain laterality  ? [facility] Vigg, Avanti, MD  ? 3 months ago Essential hypertension  ? [facility] Vigg, Avanti, MD  ? 4 months ago Chronic bilateral low back pain with sciatica, sciatica laterality unspecified  ? [facility] Vigg, Avanti, MD  ? 5 months ago Acute left-sided low back pain without sciatica  ? [facility] Vigg, Avanti, MD  ? 1 year ago Routine general medical examination at a health care facility  ? [place], [place] P, DO  ?  ?  ?Future Appointments   ?        ? In 4 months Vigg, Avanti, MD [facility]  ?  ? ?  ?  ?  ? ? ?

## 2021-06-08 ENCOUNTER — Encounter: Payer: Self-pay | Admitting: Internal Medicine

## 2021-06-08 DIAGNOSIS — M5416 Radiculopathy, lumbar region: Secondary | ICD-10-CM

## 2021-06-08 NOTE — Telephone Encounter (Signed)
Patient requesting referral to Dr. Lorenda Peck at [facility] spine center.

## 2021-07-11 ENCOUNTER — Encounter: Payer: Self-pay | Admitting: Internal Medicine

## 2021-08-21 ENCOUNTER — Ambulatory Visit: Payer: BC Managed Care – PPO | Admitting: Internal Medicine

## 2021-08-23 ENCOUNTER — Ambulatory Visit: Payer: BC Managed Care – PPO | Admitting: Physician Assistant

## 2021-08-23 VITALS — BP 151/89 | HR 87 | Temp 98.1°F | Ht 68.11 in | Wt 187.9 lb

## 2021-08-23 DIAGNOSIS — M5416 Radiculopathy, lumbar region: Secondary | ICD-10-CM

## 2021-08-23 DIAGNOSIS — I10 Essential (primary) hypertension: Secondary | ICD-10-CM

## 2021-08-23 NOTE — Assessment & Plan Note (Signed)
Chronic, ongoing condition He is seen by Neurosurgery and has tentative back surgery scheduled for tomorrow for stimulator implant Will defer to Neurosurgery for management and collaboration efforts as needed.

## 2021-08-23 NOTE — Progress Notes (Signed)
Established Patient Office Visit  Name: Chase Brooks   MRN: [id number]    DOB: 1960-07-19   Date:08/23/2021  Today's Provider: Jacquelin Hawking, MHS, PA-C Introduced myself to the patient as a PA-C and provided education on APPs in clinical practice.         Subjective  Chief Complaint  Chief Complaint  Patient presents with   Lumbar radiculopathy    Has appt for surgery tomorrow depending on ins. Was told to stop all meds including Amlodipine    Hypertension    Hypertension Pertinent negatives include no blurred vision, chest pain, headaches, palpitations or shortness of breath.    Hypertension: - Medications: On Amlodipine 2.5 mg but was instructed to stop all medications for upcoming surgery  - Compliance: Normally excellent  - Checking BP at home: occasionally checks at home  - Denies any SOB, CP, vision changes, LE edema, medication SEs, or symptoms of hypotension - Diet: Tries to eat more vegetables and has reduced his salt intake. He reports he does not drink or smoke - Exercise: Limited due to back pain. Would like to return to walking more.      Lumbar Radiculopathy States he has back surgery scheduled for tomorrow pending insurance decision Is managed by [facility] for this by  Dr. Army Melia  States he is getting a "trial" surgery for implanted stimulator device    Patient Active Problem List   Diagnosis Date Noted   Chronic low back pain with sciatica 11/15/2020   Neuropathy 11/15/2020   Lumbar radiculopathy 11/11/2020   Acute left-sided low back pain without sciatica 11/09/2020   History of colonic polyps    Essential hypertension 10/25/2017   Kidney stone 05/17/2017    Past Surgical History:  Procedure Laterality Date   COLONOSCOPY W/ POLYPECTOMY  03/26/2014   polypectomy x 2, repeat 5 years   COLONOSCOPY WITH PROPOFOL N/A 12/21/2019   Procedure: COLONOSCOPY WITH PROPOFOL;  Surgeon: Midge Minium, MD;  Location: [facility];  Service:  Endoscopy;  Laterality: N/A;    Family History  Problem Relation Age of Onset   Intracerebral hemorrhage Mother    Cancer Neg Hx    COPD Neg Hx    Diabetes Neg Hx    Heart disease Neg Hx    Hypertension Neg Hx    Stroke Neg Hx     Social History   Tobacco Use   Smoking status: Never   Smokeless tobacco: Never  Substance Use Topics   Alcohol use: No     Current Outpatient Medications:    amLODipine (NORVASC) 2.5 MG tablet, TAKE 1 TABLET BY MOUTH EVERY DAY, Disp: 90 tablet, Rfl: 1   Multiple Vitamins-Minerals (CENTRUM SILVER 50+MEN) TABS, , Disp: , Rfl:   No Known Allergies  I personally reviewed active problem list, medication list, allergies, notes from last encounter, lab results with the patient/caregiver today.   Review of Systems  HENT:  Negative for hearing loss and tinnitus.   Eyes:  Negative for blurred vision and double vision.  Respiratory:  Negative for shortness of breath and wheezing.   Cardiovascular:  Negative for chest pain, palpitations and leg swelling.  Musculoskeletal:  Positive for back pain.  Neurological:  Negative for dizziness and headaches.      Objective  Vitals:   08/23/21 0910 08/23/21 0917  BP: (!) 158/91 (!) 151/89  Pulse: 85 87  Temp: 98.1 F (36.7 C)   TempSrc: Oral   SpO2: 98%  Weight: 187 lb 14.4 oz (85.2 kg)   Height: 5' 8.11" (1.73 m)     Body mass index is 28.48 kg/m.  Physical Exam Vitals reviewed.  Constitutional:      General: He is awake.     Appearance: Normal appearance. He is well-developed, well-groomed and overweight.  HENT:     Head: Normocephalic and atraumatic.  Cardiovascular:     Rate and Rhythm: Normal rate and regular rhythm.     Pulses: Normal pulses.          Radial pulses are 2+ on the right side and 2+ on the left side.     Heart sounds: Normal heart sounds. No murmur heard.    No friction rub. No gallop.  Pulmonary:     Effort: Pulmonary effort is normal.     Breath sounds: Normal  breath sounds. No decreased air movement. No decreased breath sounds, wheezing, rhonchi or rales.  Musculoskeletal:     Cervical back: Normal range of motion and neck supple.     Right lower leg: No edema.     Left lower leg: No edema.  Neurological:     General: No focal deficit present.     Mental Status: He is alert and oriented to person, place, and time.     GCS: GCS eye subscore is 4. GCS verbal subscore is 5. GCS motor subscore is 6.     Cranial Nerves: No dysarthria or facial asymmetry.     Motor: No tremor, atrophy or abnormal muscle tone.  Psychiatric:        Attention and Perception: Attention and perception normal.        Mood and Affect: Mood and affect normal.        Speech: Speech normal.        Behavior: Behavior normal. Behavior is cooperative.      No results found for this or any previous visit (from the past 2160 hour(s)).   PHQ2/9:    08/23/2021    9:25 AM 02/20/2021    9:52 AM 12/15/2020    8:46 AM 11/09/2020    3:00 PM 11/06/2019    8:18 AM  Depression screen PHQ 2/9  Decreased Interest 0 0 0 0 0  Down, Depressed, Hopeless 0 0 0 0 0  PHQ - 2 Score 0 0 0 0 0  Altered sleeping 0 0 1 0 0  Tired, decreased energy 0 0 0 0 0  Change in appetite 0 0 1 0 0  Feeling bad or failure about yourself  0 0 0 0 0  Trouble concentrating 0 0 0 0 0  Moving slowly or fidgety/restless 0 0 0 1 0  Suicidal thoughts 0 0 0 0 0  PHQ-9 Score 0 0 2 1 0  Difficult doing work/chores  Not difficult at all  Not difficult at all Not difficult at all      Fall Risk:    08/23/2021    9:24 AM 02/20/2021    9:53 AM 12/15/2020    8:32 AM 11/09/2020    3:00 PM 11/06/2019    8:18 AM  Fall Risk   Falls in the past year? 0  0 0 0  Number falls in past yr: 0 0 0 0 0  Injury with Fall? 0 0 0 0 0  Risk for fall due to : No Fall Risks No Fall Risks No Fall Risks No Fall Risks No Fall Risks  Follow up Falls evaluation completed Falls evaluation completed  Falls evaluation completed Falls  evaluation completed Falls evaluation completed      Functional Status Survey:      Assessment & Plan  Problem List Items Addressed This Visit       Cardiovascular and Mediastinum   Essential hypertension - Primary    Chronic, historic condition Currently appears exacerbated but patient was instructed by surgical team to Dc all medications prior to surgery  He usually takes Amlodipine 2.5 mg PO QD and reports he tolerates well Recommend resuming this after surgery is completed Reviewed signs of HTN urgency and emergency and when to go to ED Recommend follow up in 3 months for monitoring         Nervous and Auditory   Lumbar radiculopathy    Chronic, ongoing condition He is seen by Neurosurgery and has tentative back surgery scheduled for tomorrow for stimulator implant Will defer to Neurosurgery for management and collaboration efforts as needed.         Return in about 3 months (around 11/23/2021) for HTN, labs.   I, Cougar Imel E Zaviyar Rahal, PA-C, have reviewed all documentation for this visit. The documentation on 08/23/21 for the exam, diagnosis, procedures, and orders are all accurate and complete.   Jacquelin Hawking, MHS, PA-C [facility]

## 2021-08-23 NOTE — Assessment & Plan Note (Signed)
Chronic, historic condition Currently appears exacerbated but patient was instructed by surgical team to Dc all medications prior to surgery  He usually takes Amlodipine 2.5 mg PO QD and reports he tolerates well Recommend resuming this after surgery is completed Reviewed signs of HTN urgency and emergency and when to go to ED Recommend follow up in 3 months for monitoring

## 2021-10-09 ENCOUNTER — Other Ambulatory Visit: Payer: Self-pay

## 2021-10-09 MED ORDER — AMLODIPINE BESYLATE 2.5 MG PO TABS: 2.5000 mg | ORAL_TABLET | Freq: Every day | ORAL | 1 refills | Status: DC

## 2021-10-09 NOTE — Telephone Encounter (Signed)
Medication refill for Amlodipne 2.5mg  last ov 08/23/21, upcoming ov 12/04/21 . Please advise

## 2021-12-04 ENCOUNTER — Ambulatory Visit: Payer: BC Managed Care – PPO | Admitting: Physician Assistant

## 2021-12-04 ENCOUNTER — Encounter: Payer: Self-pay | Admitting: Physician Assistant

## 2021-12-04 VITALS — BP 154/88 | HR 85 | Temp 98.0°F | Ht 69.0 in | Wt 196.0 lb

## 2021-12-04 DIAGNOSIS — Z8639 Personal history of other endocrine, nutritional and metabolic disease: Secondary | ICD-10-CM | POA: Diagnosis not present

## 2021-12-04 DIAGNOSIS — I10 Essential (primary) hypertension: Secondary | ICD-10-CM | POA: Diagnosis not present

## 2021-12-04 DIAGNOSIS — E782 Mixed hyperlipidemia: Secondary | ICD-10-CM | POA: Diagnosis not present

## 2021-12-04 MED ORDER — AMLODIPINE BESYLATE 5 MG PO TABS: 5.0000 mg | ORAL_TABLET | Freq: Every day | ORAL | 1 refills | Status: DC

## 2021-12-04 NOTE — Assessment & Plan Note (Signed)
Chronic, ongoing BP remains elevated today despite resuming Amlodipine 2.5 mg PO QD  Will increase to 5 mg PO QD at this time for further control Recommend checking BP daily at home and bringing records to follow up  Recommend he continue to walk and continue with dietary changes at this time Will check cholesterol at next apt as he has not been fasting Follow up in 4 weeks to assess response to medication changes

## 2021-12-04 NOTE — Assessment & Plan Note (Signed)
Chronic, reviewed most recent lipid panel results Recommend we recheck - will do so at next apt as he has not fasted today Results to dictate further management

## 2021-12-04 NOTE — Patient Instructions (Signed)
Please start taking 5 mg of Amlodipine once per day You can take 2 tablets of your 2.5 mg to equal 5 mg until you are ready to pick up the new script  Please take your BP daily and record the results  Bring these to your next follow up in 4 weeks so we can see how you are doing at home  Please continue to walk and remain active

## 2021-12-04 NOTE — Assessment & Plan Note (Signed)
Reviewed most recent notes from [facility] clinic which mention low B12 Will recheck at next lab draw Results to dictate further management

## 2021-12-04 NOTE — Progress Notes (Signed)
Established Patient Office Visit  Name: Chase Brooks   MRN: [id number]    DOB: 1960-04-23   Date:12/04/2021  Today's Provider: Talitha Givens, MHS, PA-C Introduced myself to the patient as a PA-C and provided education on APPs in clinical practice.         Subjective  Chief Complaint  Chief Complaint  Patient presents with   Hypertension    HPI  Hypertension: - Medications: Amlodipine 2.5 mg PO QD - Compliance: excellent  - Checking BP at home:  he has not been checking regularly at home but is able to  - Denies any SOB, CP, vision changes, LE edema, medication SEs, or symptoms of hypotension - Diet: still trying to follow a reduced salt diet. He does not drink sodas.  - Exercise: He is walking and doing steps for exercise. He is trying to walk as much as possible but has to stop when his back starts to hurt. Able to walk for about 30 minutes to an hour at a time. Reports he is walking everyday   Reports his insurance denied his back surgery  Has been following up with Ortho and Neurosurgery for this  Reports he is feeling much better now and Dr. Guadalupe Dawn has advised for monitoring  States he has been able to return to work, and no longer has to come home and lay down during the day.   Patient Active Problem List   Diagnosis Date Noted   Mixed hyperlipidemia 12/04/2021   Hx of non anemic vitamin B12 deficiency 12/04/2021   Chronic low back pain with sciatica 11/15/2020   Neuropathy 11/15/2020   Lumbar radiculopathy 11/11/2020   Acute left-sided low back pain without sciatica 11/09/2020   History of colonic polyps    Essential hypertension 10/25/2017   Kidney stone 05/17/2017    Past Surgical History:  Procedure Laterality Date   COLONOSCOPY W/ POLYPECTOMY  03/26/2014   polypectomy x 2, repeat 5 years   COLONOSCOPY WITH PROPOFOL N/A 12/21/2019   Procedure: COLONOSCOPY WITH PROPOFOL;  Surgeon: Lucilla Lame, MD;  Location: [place];  Service:  Endoscopy;  Laterality: N/A;    Family History  Problem Relation Age of Onset   Intracerebral hemorrhage Mother    Cancer Neg Hx    COPD Neg Hx    Diabetes Neg Hx    Heart disease Neg Hx    Hypertension Neg Hx    Stroke Neg Hx     Social History   Tobacco Use   Smoking status: Never   Smokeless tobacco: Never  Substance Use Topics   Alcohol use: No     Current Outpatient Medications:    amLODipine (NORVASC) 5 MG tablet, Take 1 tablet (5 mg total) by mouth daily., Disp: 90 tablet, Rfl: 1   Multiple Vitamins-Minerals (CENTRUM SILVER 50+MEN) TABS, , Disp: , Rfl:   No Known Allergies  I personally reviewed active problem list, medication list, allergies, health maintenance, notes from last encounter, lab results with the patient/caregiver today.   Review of Systems  Constitutional:  Negative for chills, fever and malaise/fatigue.  Eyes:  Negative for blurred vision and double vision.  Respiratory:  Negative for shortness of breath and wheezing.   Cardiovascular:  Negative for chest pain, palpitations and leg swelling.  Musculoskeletal:  Positive for back pain.  Neurological:  Negative for dizziness, focal weakness, weakness and headaches.      Objective  Vitals:   12/04/21 7078  BP: (!) 154/88  Pulse: 85  Temp: 98 F (36.7 C)  SpO2: 97%  Weight: 196 lb (88.9 kg)  Height: _0  (1.753 m)    Body mass index is 28.94 kg/m.  Physical Exam Vitals reviewed.  Constitutional:      Appearance: Normal appearance.  HENT:     Head: Normocephalic and atraumatic.  Eyes:     Extraocular Movements: Extraocular movements intact.     Conjunctiva/sclera: Conjunctivae normal.     Pupils: Pupils are equal, round, and reactive to light.  Cardiovascular:     Rate and Rhythm: Normal rate and regular rhythm.     Pulses: Normal pulses.     Heart sounds: Normal heart sounds. No murmur heard.    No friction rub. No gallop.  Pulmonary:     Effort: Pulmonary effort is  normal. No respiratory distress.     Breath sounds: Normal breath sounds. No wheezing, rhonchi or rales.  Musculoskeletal:     Cervical back: Normal range of motion.  Neurological:     General: No focal deficit present.     Mental Status: He is alert and oriented to person, place, and time.  Psychiatric:        Mood and Affect: Mood normal.        Behavior: Behavior normal.        Thought Content: Thought content normal.        Judgment: Judgment normal.      No results found for this or any previous visit (from the past 2160 hour(s)).   PHQ2/9:    12/04/2021    8:58 AM 08/23/2021    9:25 AM 02/20/2021    9:52 AM 12/15/2020    8:46 AM 11/09/2020    3:00 PM  Depression screen PHQ 2/9  Decreased Interest 0 0 0 0 0  Down, Depressed, Hopeless 0 0 0 0 0  PHQ - 2 Score 0 0 0 0 0  Altered sleeping 0 0 0 1 0  Tired, decreased energy 0 0 0 0 0  Change in appetite 0 0 0 1 0  Feeling bad or failure about yourself  0 0 0 0 0  Trouble concentrating 0 0 0 0 0  Moving slowly or fidgety/restless 0 0 0 0 1  Suicidal thoughts 0 0 0 0 0  PHQ-9 Score 0 0 0 2 1  Difficult doing work/chores Not difficult at all  Not difficult at all  Not difficult at all      Fall Risk:    12/04/2021    8:57 AM 08/23/2021    9:24 AM 02/20/2021    9:53 AM 12/15/2020    8:32 AM 11/09/2020    3:00 PM  [place] in the past year? 0 0  0 0  Number falls in past yr: 0 0 0 0 0  Injury with Fall? 0 0 0 0 0  Risk for fall due to : _1   Follow up _2       Assessment & Plan  Problem List Items Addressed This Visit       Cardiovascular and Mediastinum   Essential hypertension - Primary    Chronic, ongoing BP remains elevated today despite resuming Amlodipine 2.5 mg PO QD  Will increase to 5 mg PO QD at  this time for further  control Recommend checking BP daily at home and bringing records to follow up  Recommend he continue to walk and continue with dietary changes at this time Will check cholesterol at next apt as he has not been fasting Follow up in 4 weeks to assess response to medication changes       Relevant Medications   amLODipine (NORVASC) 5 MG tablet   Other Relevant Orders   Lipid Profile   Comp Met (CMET)   CBC w/Diff     Other   Mixed hyperlipidemia    Chronic, reviewed most recent lipid panel results Recommend we recheck - will do so at next apt as he has not fasted today Results to dictate further management       Relevant Medications   amLODipine (NORVASC) 5 MG tablet   Other Relevant Orders   Lipid Profile   Hx of non anemic vitamin B12 deficiency    Reviewed most recent notes from [place] clinic which mention low B12 Will recheck at next lab draw Results to dictate further management       Relevant Orders   B12     Return in about 4 weeks (around 01/01/2022) for HTN.   I, Euna Armon E Shalva Rozycki, PA-C, have reviewed all documentation for this visit. The documentation on 12/04/21 for the exam, diagnosis, procedures, and orders are all accurate and complete.   Talitha Givens, MHS, PA-C [facility]

## 2022-01-02 ENCOUNTER — Ambulatory Visit: Payer: BC Managed Care – PPO | Admitting: Family Medicine

## 2022-01-02 ENCOUNTER — Encounter: Payer: Self-pay | Admitting: Family Medicine

## 2022-01-02 VITALS — BP 112/76 | HR 86 | Temp 98.6°F | Wt 196.1 lb

## 2022-01-02 DIAGNOSIS — Z23 Encounter for immunization: Secondary | ICD-10-CM | POA: Diagnosis not present

## 2022-01-02 DIAGNOSIS — I10 Essential (primary) hypertension: Secondary | ICD-10-CM

## 2022-01-02 DIAGNOSIS — Z0000 Encounter for general adult medical examination without abnormal findings: Secondary | ICD-10-CM

## 2022-01-02 LAB — URINALYSIS, ROUTINE W REFLEX MICROSCOPIC
Bilirubin, UA: NEGATIVE
Glucose, UA: NEGATIVE
Ketones, UA: NEGATIVE
Leukocytes,UA: NEGATIVE
Nitrite, UA: NEGATIVE
Protein,UA: NEGATIVE
RBC, UA: NEGATIVE
Specific Gravity, UA: 1.02 (ref 1.005–1.030)
Urobilinogen, Ur: 0.2 mg/dL (ref 0.2–1.0)
pH, UA: 7 (ref 5.0–7.5)

## 2022-01-02 LAB — MICROALBUMIN, URINE WAIVED
Creatinine, Urine Waived: 50 mg/dL (ref 10–300)
Microalb, Ur Waived: 30 mg/L — ABNORMAL HIGH (ref 0–19)

## 2022-01-02 MED ORDER — AMLODIPINE BESYLATE 5 MG PO TABS: 5.0000 mg | ORAL_TABLET | Freq: Every day | ORAL | 0 refills | Status: DC

## 2022-01-02 NOTE — Progress Notes (Signed)
BP 112/76   Pulse 86   Temp 98.6 F (37 C) (Oral)   Wt 196 lb 1.6 oz (89 kg)   SpO2 96%   BMI 28.96 kg/m    Subjective:    Patient ID: Chase Brooks, male    DOB: [birth year], 61 y.o.   MRN: [id number]  HPI: Orhan Mayorga is a 61 y.o. male presenting on 01/02/2022 for comprehensive medical examination. Current medical complaints include:  HYPERTENSION  Hypertension status: controlled  Satisfied with current treatment? yes Duration of hypertension: chronic BP monitoring frequency:  not checking BP medication side effects:  no Medication compliance: excellent compliance Previous BP meds:amlodipine Aspirin: no Recurrent headaches: no Visual changes: no Palpitations: no Dyspnea: no Chest pain: no Lower extremity edema: no Dizzy/lightheaded: no  Interim Problems from his last visit: no  Depression Screen done today and results listed below:     01/02/2022    8:53 AM 12/04/2021    8:58 AM 08/23/2021    9:25 AM 02/20/2021    9:52 AM 12/15/2020    8:46 AM  Depression screen PHQ 2/9  Decreased Interest 0 0 0 0 0  Down, Depressed, Hopeless 0 0 0 0 0  PHQ - 2 Score 0 0 0 0 0  Altered sleeping 0 0 0 0 1  Tired, decreased energy 0 0 0 0 0  Change in appetite 0 0 0 0 1  Feeling bad or failure about yourself  0 0 0 0 0  Trouble concentrating 0 0 0 0 0  Moving slowly or fidgety/restless 0 0 0 0 0  Suicidal thoughts 0 0 0 0 0  PHQ-9 Score 0 0 0 0 2  Difficult doing work/chores Not difficult at all Not difficult at all  Not difficult at all     Past Medical History:  Past Medical History:  Diagnosis Date   Nephrolithiasis    Vertigo     Surgical History:  Past Surgical History:  Procedure Laterality Date   COLONOSCOPY W/ POLYPECTOMY  03/26/2014   polypectomy x 2, repeat 5 years   COLONOSCOPY WITH PROPOFOL N/A 12/21/2019   Procedure: COLONOSCOPY WITH PROPOFOL;  Surgeon: Lucilla Lame, MD;  Location: [facility];  Service: Endoscopy;  Laterality: N/A;     Medications:  Current Outpatient Medications on File Prior to Visit  Medication Sig   Multiple Vitamins-Minerals (CENTRUM SILVER 50+MEN) TABS    No current facility-administered medications on file prior to visit.    Allergies:  No Known Allergies  Social History:  Social History   Socioeconomic History   Marital status: Married    Spouse name: Not on file   Number of children: Not on file   Years of education: Not on file   Highest education level: Not on file  Occupational History   Not on file  Tobacco Use   Smoking status: Never   Smokeless tobacco: Never  Vaping Use   Vaping Use: Never used  Substance and Sexual Activity   Alcohol use: No   Drug use: No   Sexual activity: Not on file  Other Topics Concern   Not on file  Social History Narrative   Not on file   Social Determinants of Health   Financial Resource Strain: Not on file  Food Insecurity: Not on file  Transportation Needs: Not on file  Physical Activity: Not on file  Stress: Not on file  Social Connections: Not on file  Intimate Partner Violence: Not on file  Social History   Tobacco Use  Smoking Status Never  Smokeless Tobacco Never   Social History   Substance and Sexual Activity  Alcohol Use No    Family History:  Family History  Problem Relation Age of Onset   Intracerebral hemorrhage Mother    Cancer Neg Hx    COPD Neg Hx    Diabetes Neg Hx    Heart disease Neg Hx    Hypertension Neg Hx    Stroke Neg Hx     Past medical history, surgical history, medications, allergies, family history and social history reviewed with patient today and changes made to appropriate areas of the chart.   Review of Systems  Constitutional: Negative.   HENT: Negative.    Eyes: Negative.   Respiratory: Negative.    Cardiovascular: Negative.   Gastrointestinal:  Positive for constipation. Negative for abdominal pain, blood in stool, diarrhea, heartburn, melena, nausea and vomiting.   Genitourinary: Negative.   Musculoskeletal: Negative.   Skin: Negative.   Neurological: Negative.   Endo/Heme/Allergies:  Negative for environmental allergies and polydipsia. Bruises/bleeds easily.  Psychiatric/Behavioral: Negative.     All other ROS negative except what is listed above and in the HPI.      Objective:    BP 112/76   Pulse 86   Temp 98.6 F (37 C) (Oral)   Wt 196 lb 1.6 oz (89 kg)   SpO2 96%   BMI 28.96 kg/m   Wt Readings from Last 3 Encounters:  01/02/22 196 lb 1.6 oz (89 kg)  12/04/21 196 lb (88.9 kg)  08/23/21 187 lb 14.4 oz (85.2 kg)    Physical Exam Vitals and nursing note reviewed.  Constitutional:      General: He is not in acute distress.    Appearance: Normal appearance. He is normal weight. He is not ill-appearing, toxic-appearing or diaphoretic.  HENT:     Head: Normocephalic and atraumatic.     Right Ear: Tympanic membrane, ear canal and external ear normal. There is no impacted cerumen.     Left Ear: Tympanic membrane, ear canal and external ear normal. There is no impacted cerumen.     Nose: Nose normal. No congestion or rhinorrhea.     Mouth/Throat:     Mouth: Mucous membranes are moist.     Pharynx: Oropharynx is clear. No oropharyngeal exudate or posterior oropharyngeal erythema.  Eyes:     General: No scleral icterus.       Right eye: No discharge.        Left eye: No discharge.     Extraocular Movements: Extraocular movements intact.     Conjunctiva/sclera: Conjunctivae normal.     Pupils: Pupils are equal, round, and reactive to light.  Neck:     Vascular: No carotid bruit.  Cardiovascular:     Rate and Rhythm: Normal rate and regular rhythm.     Pulses: Normal pulses.     Heart sounds: No murmur heard.    No friction rub. No gallop.  Pulmonary:     Effort: Pulmonary effort is normal. No respiratory distress.     Breath sounds: Normal breath sounds. No stridor. No wheezing, rhonchi or rales.  Chest:     Chest wall: No  tenderness.  Abdominal:     General: Abdomen is flat. Bowel sounds are normal. There is no distension.     Palpations: Abdomen is soft. There is no mass.     Tenderness: There is no abdominal tenderness. There is no right  CVA tenderness, left CVA tenderness, guarding or rebound.     Hernia: No hernia is present.  Genitourinary:    Comments: Genital exam deferred with shared decision making Musculoskeletal:        General: No swelling, tenderness, deformity or signs of injury.     Cervical back: Normal range of motion and neck supple. No rigidity. No muscular tenderness.     Right lower leg: No edema.     Left lower leg: No edema.  Lymphadenopathy:     Cervical: No cervical adenopathy.  Skin:    General: Skin is warm and dry.     Capillary Refill: Capillary refill takes less than 2 seconds.     Coloration: Skin is not jaundiced or pale.     Findings: No bruising, erythema, lesion or rash.  Neurological:     General: No focal deficit present.     Mental Status: He is alert and oriented to person, place, and time.     Cranial Nerves: No cranial nerve deficit.     Sensory: No sensory deficit.     Motor: No weakness.     Coordination: Coordination normal.     Gait: Gait normal.     Deep Tendon Reflexes: Reflexes normal.  Psychiatric:        Mood and Affect: Mood normal.        Behavior: Behavior normal.        Thought Content: Thought content normal.        Judgment: Judgment normal.     Results for orders placed or performed in visit on 02/20/21  Microscopic Examination   Urine  Result Value Ref Range   WBC, UA None seen 0 - 5 /hpf   RBC, Urine 0-2 0 - 2 /hpf   Epithelial Cells (non renal) None seen 0 - 10 /hpf   Mucus, UA Present (A) Not Estab.   Bacteria, UA None seen None seen/Few  CBC with Differential/Platelet  Result Value Ref Range   WBC 8.2 3.4 - 10.8 x10E3/uL   RBC 5.06 4.14 - 5.80 x10E6/uL   Hemoglobin 16.3 13.0 - 17.7 g/dL   Hematocrit 47.9 37.5 - 51.0 %    MCV 95 79 - 97 fL   MCH 32.2 26.6 - 33.0 pg   MCHC 34.0 31.5 - 35.7 g/dL   RDW 12.7 11.6 - 15.4 %   Platelets 251 150 - 450 x10E3/uL   Neutrophils 64 Not Estab. %   Lymphs 25 Not Estab. %   Monocytes 8 Not Estab. %   Eos 2 Not Estab. %   Basos 1 Not Estab. %   Neutrophils Absolute 5.2 1.4 - 7.0 x10E3/uL   Lymphocytes Absolute 2.1 0.7 - 3.1 x10E3/uL   Monocytes Absolute 0.7 0.1 - 0.9 x10E3/uL   EOS (ABSOLUTE) 0.2 0.0 - 0.4 x10E3/uL   Basophils Absolute 0.0 0.0 - 0.2 x10E3/uL   Immature Granulocytes 0 Not Estab. %   Immature Grans (Abs) 0.0 0.0 - 0.1 x10E3/uL  Comprehensive metabolic panel  Result Value Ref Range   Glucose 83 70 - 99 mg/dL   BUN 15 6 - 24 mg/dL   Creatinine, Ser 0.92 0.76 - 1.27 mg/dL   eGFR 96 >59 mL/min/1.73   BUN/Creatinine Ratio 16 9 - 20   Sodium 139 134 - 144 mmol/L   Potassium 4.2 3.5 - 5.2 mmol/L   Chloride 101 96 - 106 mmol/L   CO2 26 20 - 29 mmol/L   Calcium 9.6 8.7 - 10.2 mg/dL  Total Protein 7.1 6.0 - 8.5 g/dL   Albumin 4.5 3.8 - 4.9 g/dL   Globulin, Total 2.6 1.5 - 4.5 g/dL   Albumin/Globulin Ratio 1.7 1.2 - 2.2   Bilirubin Total 0.3 0.0 - 1.2 mg/dL   Alkaline Phosphatase 104 44 - 121 IU/L   AST 22 0 - 40 IU/L   ALT 19 0 - 44 IU/L  PSA  Result Value Ref Range   Prostate Specific Ag, Serum 0.8 0.0 - 4.0 ng/mL  TSH  Result Value Ref Range   TSH 2.720 0.450 - 4.500 uIU/mL  Urinalysis, Routine w reflex microscopic  Result Value Ref Range   Specific Gravity, UA >1.030 (H) 1.005 - 1.030   pH, UA 5.5 5.0 - 7.5   Color, UA Negative Yellow   Appearance Ur Negative Clear   Leukocytes,UA Negative Negative   Protein,UA Negative Negative/Trace   Glucose, UA Negative Negative   Ketones, UA Negative Negative   RBC, UA Trace (A) Negative   Bilirubin, UA Negative Negative   Urobilinogen, Ur 0.2 0.2 - 1.0 mg/dL   Nitrite, UA Negative Negative   Microscopic Examination See below:   Lipid panel  Result Value Ref Range   Cholesterol, Total 223 (H)  100 - 199 mg/dL   Triglycerides 165 (H) 0 - 149 mg/dL   HDL 43 >39 mg/dL   VLDL Cholesterol Cal 30 5 - 40 mg/dL   LDL Chol Calc (NIH) 150 (H) 0 - 99 mg/dL   Chol/HDL Ratio 5.2 (H) 0.0 - 5.0 ratio      Assessment & Plan:   Problem List Items Addressed This Visit       Cardiovascular and Mediastinum   Essential hypertension    Under good control on current regimen. Continue current regimen. Continue to monitor. Call with any concerns. Refills given. Labs checked today.       Relevant Medications   amLODipine (NORVASC) 5 MG tablet   Other Relevant Orders   Microalbumin, Urine Waived   Other Visit Diagnoses     Routine general medical examination at a health care facility    -  Primary   Vaccines up to date. Screening labs checked today. Colonoscopy up to date. Continue diet and exercise. Call with any concerns.   Relevant Orders   Comprehensive metabolic panel   CBC with Differential/Platelet   Lipid Panel w/o Chol/HDL Ratio   PSA   TSH   Urinalysis, Routine w reflex microscopic   Microalbumin, Urine Waived   Need for diphtheria-tetanus-pertussis (Tdap) vaccine       Tdap given today.   Relevant Orders   Tdap vaccine greater than or equal to 7yo IM        LABORATORY TESTING:  Health maintenance labs ordered today as discussed above.   The natural history of prostate cancer and ongoing controversy regarding screening and potential treatment outcomes of prostate cancer has been discussed with the patient. The meaning of a false positive PSA and a false negative PSA has been discussed. He indicates understanding of the limitations of this screening test and wishes to proceed with screening PSA testing.   IMMUNIZATIONS:   - Tdap: Tetanus vaccination status reviewed: Tdap vaccination indicated and given today. - Influenza: Refused - Pneumovax: Not applicable - Prevnar: Not applicable - COVID: Up to date - HPV: Not applicable - Shingrix vaccine:  Refused  SCREENING: - Colonoscopy: Up to date  Discussed with patient purpose of the colonoscopy is to detect colon cancer at curable precancerous or  early stages   PATIENT COUNSELING:    Sexuality: Discussed sexually transmitted diseases, partner selection, use of condoms, avoidance of unintended pregnancy  and contraceptive alternatives.   Advised to avoid cigarette smoking.  I discussed with the patient that most people either abstain from alcohol or drink within safe limits (<=14/week and <=4 drinks/occasion for males, <=7/weeks and <= 3 drinks/occasion for females) and that the risk for alcohol disorders and other health effects rises proportionally with the number of drinks per week and how often a drinker exceeds daily limits.  Discussed cessation/primary prevention of drug use and availability of treatment for abuse.   Diet: Encouraged to adjust caloric intake to maintain  or achieve ideal body weight, to reduce intake of dietary saturated fat and total fat, to limit sodium intake by avoiding high sodium foods and not adding table salt, and to maintain adequate dietary potassium and calcium preferably from fresh fruits, vegetables, and low-fat dairy products.    stressed the importance of regular exercise  Injury prevention: Discussed safety belts, safety helmets, smoke detector, smoking near bedding or upholstery.   Dental health: Discussed importance of regular tooth brushing, flossing, and dental visits.   Follow up plan: NEXT PREVENTATIVE PHYSICAL DUE IN 1 YEAR. Return in about 6 months (around 07/04/2022).

## 2022-01-02 NOTE — Assessment & Plan Note (Signed)
Under good control on current regimen. Continue current regimen. Continue to monitor. Call with any concerns. Refills given. Labs checked today.  

## 2022-01-03 LAB — LIPID PANEL W/O CHOL/HDL RATIO
Cholesterol, Total: 220 mg/dL — ABNORMAL HIGH (ref 100–199)
HDL: 45 mg/dL (ref >39)
LDL Chol Calc (NIH): 150 mg/dL — ABNORMAL HIGH (ref 0–99)
Triglycerides: 138 mg/dL (ref 0–149)
VLDL Cholesterol Cal: 25 mg/dL (ref 5–40)

## 2022-01-03 LAB — CBC WITH DIFFERENTIAL/PLATELET
Basophils Absolute: 0 10*3/uL (ref 0.0–0.2)
Basos: 1 %
EOS (ABSOLUTE): 0.1 10*3/uL (ref 0.0–0.4)
Eos: 1 %
Hematocrit: 50.2 % (ref 37.5–51.0)
Hemoglobin: 17.2 g/dL (ref 13.0–17.7)
Immature Grans (Abs): 0 10*3/uL (ref 0.0–0.1)
Immature Granulocytes: 0 %
Lymphocytes Absolute: 2 10*3/uL (ref 0.7–3.1)
Lymphs: 24 %
MCH: 32.1 pg (ref 26.6–33.0)
MCHC: 34.3 g/dL (ref 31.5–35.7)
MCV: 94 fL (ref 79–97)
Monocytes Absolute: 0.5 10*3/uL (ref 0.1–0.9)
Monocytes: 6 %
Neutrophils Absolute: 5.9 10*3/uL (ref 1.4–7.0)
Neutrophils: 68 %
Platelets: 252 10*3/uL (ref 150–450)
RBC: 5.36 x10E6/uL (ref 4.14–5.80)
RDW: 12.8 % (ref 11.6–15.4)
WBC: 8.5 10*3/uL (ref 3.4–10.8)

## 2022-01-03 LAB — COMPREHENSIVE METABOLIC PANEL
ALT: 16 IU/L (ref 0–44)
AST: 22 IU/L (ref 0–40)
Albumin/Globulin Ratio: 1.8 (ref 1.2–2.2)
Albumin: 4.6 g/dL (ref 3.8–4.9)
Alkaline Phosphatase: 103 IU/L (ref 44–121)
BUN/Creatinine Ratio: 13 (ref 9–20)
BUN: 10 mg/dL (ref 6–24)
Bilirubin Total: 0.4 mg/dL (ref 0.0–1.2)
CO2: 22 mmol/L (ref 20–29)
Calcium: 9.2 mg/dL (ref 8.7–10.2)
Chloride: 102 mmol/L (ref 96–106)
Creatinine, Ser: 0.76 mg/dL (ref 0.76–1.27)
Globulin, Total: 2.5 g/dL (ref 1.5–4.5)
Glucose: 78 mg/dL (ref 70–99)
Potassium: 4.4 mmol/L (ref 3.5–5.2)
Sodium: 139 mmol/L (ref 134–144)
Total Protein: 7.1 g/dL (ref 6.0–8.5)
eGFR: 104 mL/min/{1.73_m2} (ref >59)

## 2022-01-03 LAB — TSH: TSH: 2.06 u[IU]/mL (ref 0.450–4.500)

## 2022-01-03 LAB — PSA: Prostate Specific Ag, Serum: 0.6 ng/mL (ref 0.0–4.0)

## 2022-04-10 ENCOUNTER — Other Ambulatory Visit: Payer: Self-pay | Admitting: Family Medicine

## 2022-04-10 NOTE — Telephone Encounter (Signed)
Dose was changed on 01/02/22 Requested Prescriptions  Pending Prescriptions Disp Refills   amLODipine (NORVASC) 2.5 MG tablet [Pharmacy Med Name: AMLODIPINE BESYLATE 2.5 MG TAB] 90 tablet 1    Sig: TAKE 1 TABLET BY MOUTH EVERY DAY     Cardiovascular: Calcium Channel Blockers 2 Passed - 04/10/2022  8:09 AM      Passed - Last BP in normal range    BP Readings from Last 1 Encounters:  01/02/22 112/76         Passed - Last Heart Rate in normal range    Pulse Readings from Last 1 Encounters:  01/02/22 86         Passed - Valid encounter within last 6 months    Recent Outpatient Visits           3 months ago Routine general medical examination at a health care facility   [facility], Megan P, DO   4 months ago Essential hypertension   [place], Dani Gobble, PA-C   7 months ago Essential hypertension   [place], Erin E, PA-C   1 year ago Chronic low back pain with sciatica, sciatica laterality unspecified, unspecified back pain laterality   [place] [facility], Avanti, MD   1 year ago Essential hypertension   [place] [facility], Avanti, MD       Future Appointments             In 2 months Johnson, Barb Merino, DO [place]

## 2022-07-06 ENCOUNTER — Ambulatory Visit: Payer: BC Managed Care – PPO | Admitting: Family Medicine

## 2022-07-06 ENCOUNTER — Encounter: Payer: Self-pay | Admitting: Family Medicine

## 2022-07-06 VITALS — BP 133/83 | HR 78 | Temp 98.4°F | Ht 69.0 in | Wt 197.8 lb

## 2022-07-06 DIAGNOSIS — H6991 Unspecified Eustachian tube disorder, right ear: Secondary | ICD-10-CM | POA: Diagnosis not present

## 2022-07-06 DIAGNOSIS — E782 Mixed hyperlipidemia: Secondary | ICD-10-CM | POA: Diagnosis not present

## 2022-07-06 DIAGNOSIS — I10 Essential (primary) hypertension: Secondary | ICD-10-CM

## 2022-07-06 MED ORDER — AMLODIPINE BESYLATE 5 MG PO TABS: 5.0000 mg | ORAL_TABLET | Freq: Every day | ORAL | 1 refills | Status: DC

## 2022-07-06 NOTE — Progress Notes (Signed)
BP 133/83   Pulse 78   Temp 98.4 F (36.9 C) (Oral)   Ht 5\' 9"  (1.753 m)   Wt 197 lb 12.8 oz (89.7 kg)   SpO2 97%   BMI 29.21 kg/m    Subjective:    Patient ID: Chase Brooks, male    DOB: 20-Sep-1960, 62 y.o.   MRN: [id number]  HPI: Wilman Tucker is a 62 y.o. male  Chief Complaint  Patient presents with   Hypertension   Hyperlipidemia   HYPERTENSION / HYPERLIPIDEMIA Satisfied with current treatment? yes Duration of hypertension: chronic BP monitoring frequency: not checking BP medication side effects: no Past BP meds: amlodipine Duration of hyperlipidemia: chronic Cholesterol medication side effects: no Cholesterol supplements: none Past cholesterol medications: none Medication compliance: excellent compliance Aspirin: no Recent stressors: no Recurrent headaches: no Visual changes: no Palpitations: no Dyspnea: no Chest pain: no Lower extremity edema: no Dizzy/lightheaded: no  EAR PAIN Duration: off and on for a couple of months Involved ear(s): right Severity:  mild  Quality:  soreness Fever: no Otorrhea: no Upper respiratory infection symptoms: no Pruritus: no Hearing loss: no Water immersion no Using Q-tips: yes Recurrent otitis media: no Status: stable Treatments attempted: none   Relevant past medical, surgical, family and social history reviewed and updated as indicated. Interim medical history since our last visit reviewed. Allergies and medications reviewed and updated.  Review of Systems  Constitutional: Negative.   HENT:  Positive for ear pain. Negative for congestion, dental problem, drooling, ear discharge, facial swelling, hearing loss, mouth sores, nosebleeds, postnasal drip, rhinorrhea, sinus pressure, sinus pain, sneezing, sore throat, tinnitus, trouble swallowing and voice change.   Respiratory: Negative.    Cardiovascular: Negative.   Gastrointestinal: Negative.   Musculoskeletal: Negative.   Psychiatric/Behavioral:  Negative.      Per HPI unless specifically indicated above     Objective:    BP 133/83   Pulse 78   Temp 98.4 F (36.9 C) (Oral)   Ht 5\' 9"  (1.753 m)   Wt 197 lb 12.8 oz (89.7 kg)   SpO2 97%   BMI 29.21 kg/m   Wt Readings from Last 3 Encounters:  07/06/22 197 lb 12.8 oz (89.7 kg)  01/02/22 196 lb 1.6 oz (89 kg)  12/04/21 196 lb (88.9 kg)    Physical Exam Vitals and nursing note reviewed.  Constitutional:      General: He is not in acute distress.    Appearance: Normal appearance. He is not ill-appearing, toxic-appearing or diaphoretic.  HENT:     Head: Normocephalic and atraumatic.     Right Ear: Tympanic membrane, ear canal and external ear normal.     Left Ear: Tympanic membrane, ear canal and external ear normal.     Ears:     Comments: Very dry EACs bilaterally    Nose: Nose normal.     Mouth/Throat:     Mouth: Mucous membranes are moist.     Pharynx: Oropharynx is clear.  Eyes:     General: No scleral icterus.       Right eye: No discharge.        Left eye: No discharge.     Extraocular Movements: Extraocular movements intact.     Conjunctiva/sclera: Conjunctivae normal.     Pupils: Pupils are equal, round, and reactive to light.  Cardiovascular:     Rate and Rhythm: Normal rate and regular rhythm.     Pulses: Normal pulses.     Heart sounds: Normal  heart sounds. No murmur heard.    No friction rub. No gallop.  Pulmonary:     Effort: Pulmonary effort is normal. No respiratory distress.     Breath sounds: Normal breath sounds. No stridor. No wheezing, rhonchi or rales.  Chest:     Chest wall: No tenderness.  Musculoskeletal:        General: Normal range of motion.     Cervical back: Normal range of motion and neck supple.  Skin:    General: Skin is warm and dry.     Capillary Refill: Capillary refill takes less than 2 seconds.     Coloration: Skin is not jaundiced or pale.     Findings: No bruising, erythema, lesion or rash.  Neurological:      General: No focal deficit present.     Mental Status: He is alert and oriented to person, place, and time. Mental status is at baseline.  Psychiatric:        Mood and Affect: Mood normal.        Behavior: Behavior normal.        Thought Content: Thought content normal.        Judgment: Judgment normal.     Results for orders placed or performed in visit on 01/02/22  Comprehensive metabolic panel  Result Value Ref Range   Glucose 78 70 - 99 mg/dL   BUN 10 6 - 24 mg/dL   Creatinine, Ser 1.30 0.76 - 1.27 mg/dL   eGFR 865 >78 IO/NGE/9.52   BUN/Creatinine Ratio 13 9 - 20   Sodium 139 134 - 144 mmol/L   Potassium 4.4 3.5 - 5.2 mmol/L   Chloride 102 96 - 106 mmol/L   CO2 22 20 - 29 mmol/L   Calcium 9.2 8.7 - 10.2 mg/dL   Total Protein 7.1 6.0 - 8.5 g/dL   Albumin 4.6 3.8 - 4.9 g/dL   Globulin, Total 2.5 1.5 - 4.5 g/dL   Albumin/Globulin Ratio 1.8 1.2 - 2.2   Bilirubin Total 0.4 0.0 - 1.2 mg/dL   Alkaline Phosphatase 103 44 - 121 IU/L   AST 22 0 - 40 IU/L   ALT 16 0 - 44 IU/L  CBC with Differential/Platelet  Result Value Ref Range   WBC 8.5 3.4 - 10.8 x10E3/uL   RBC 5.36 4.14 - 5.80 x10E6/uL   Hemoglobin 17.2 13.0 - 17.7 g/dL   Hematocrit 84.1 32.4 - 51.0 %   MCV 94 79 - 97 fL   MCH 32.1 26.6 - 33.0 pg   MCHC 34.3 31.5 - 35.7 g/dL   RDW 40.1 02.7 - 25.3 %   Platelets 252 150 - 450 x10E3/uL   Neutrophils 68 Not Estab. %   Lymphs 24 Not Estab. %   Monocytes 6 Not Estab. %   Eos 1 Not Estab. %   Basos 1 Not Estab. %   Neutrophils Absolute 5.9 1.4 - 7.0 x10E3/uL   Lymphocytes Absolute 2.0 0.7 - 3.1 x10E3/uL   Monocytes Absolute 0.5 0.1 - 0.9 x10E3/uL   EOS (ABSOLUTE) 0.1 0.0 - 0.4 x10E3/uL   Basophils Absolute 0.0 0.0 - 0.2 x10E3/uL   Immature Granulocytes 0 Not Estab. %   Immature Grans (Abs) 0.0 0.0 - 0.1 x10E3/uL  Lipid Panel w/o Chol/HDL Ratio  Result Value Ref Range   Cholesterol, Total 220 (H) 100 - 199 mg/dL   Triglycerides 664 0 - 149 mg/dL   HDL 45 >40 mg/dL    VLDL Cholesterol Cal 25 5 - 40 mg/dL  LDL Chol Calc (NIH) 150 (H) 0 - 99 mg/dL  PSA  Result Value Ref Range   Prostate Specific Ag, Serum 0.6 0.0 - 4.0 ng/mL  TSH  Result Value Ref Range   TSH 2.060 0.450 - 4.500 uIU/mL  Urinalysis, Routine w reflex microscopic  Result Value Ref Range   Specific Gravity, UA 1.020 1.005 - 1.030   pH, UA 7.0 5.0 - 7.5   Color, UA Yellow Yellow   Appearance Ur Clear Clear   Leukocytes,UA Negative Negative   Protein,UA Negative Negative/Trace   Glucose, UA Negative Negative   Ketones, UA Negative Negative   RBC, UA Negative Negative   Bilirubin, UA Negative Negative   Urobilinogen, Ur 0.2 0.2 - 1.0 mg/dL   Nitrite, UA Negative Negative   Microscopic Examination Comment   Microalbumin, Urine Waived  Result Value Ref Range   Microalb, Ur Waived 30 (H) 0 - 19 mg/L   Creatinine, Urine Waived 50 10 - 300 mg/dL   Microalb/Creat Ratio 30-300 (H) <30 mg/g      Assessment & Plan:   Problem List Items Addressed This Visit       Cardiovascular and Mediastinum   Essential hypertension - Primary    Under good control on current regimen. Continue current regimen. Continue to monitor. Call with any concerns. Refills given. Labs drawn today.       Relevant Medications   amLODipine (NORVASC) 5 MG tablet   Other Relevant Orders   Comprehensive metabolic panel     Other   Mixed hyperlipidemia    Rechecking labs today. Await results. Treat as needed.       Relevant Medications   amLODipine (NORVASC) 5 MG tablet   Other Relevant Orders   Comprehensive metabolic panel   Lipid Panel w/o Chol/HDL Ratio   Other Visit Diagnoses     ETD (Eustachian tube dysfunction), right       very dry EAC. Discussed baby/mineral oil. Call with any concerns. Continue to montior.        Follow up plan: Return in about 6 months (around 01/05/2023), or physical.

## 2022-07-06 NOTE — Assessment & Plan Note (Signed)
Under good control on current regimen. Continue current regimen. Continue to monitor. Call with any concerns. Refills given. Labs drawn today.   

## 2022-07-06 NOTE — Assessment & Plan Note (Signed)
Rechecking labs today. Await results. Treat as needed.  °

## 2022-07-07 LAB — COMPREHENSIVE METABOLIC PANEL
ALT: 14 IU/L (ref 0–44)
AST: 17 IU/L (ref 0–40)
Albumin: 4.2 g/dL (ref 3.8–4.9)
Alkaline Phosphatase: 115 IU/L (ref 44–121)
BUN/Creatinine Ratio: 11 (ref 10–24)
BUN: 10 mg/dL (ref 8–27)
Bilirubin Total: 0.5 mg/dL (ref 0.0–1.2)
CO2: 22 mmol/L (ref 20–29)
Calcium: 8.9 mg/dL (ref 8.6–10.2)
Chloride: 103 mmol/L (ref 96–106)
Creatinine, Ser: 0.91 mg/dL (ref 0.76–1.27)
Globulin, Total: 2.6 g/dL (ref 1.5–4.5)
Glucose: 80 mg/dL (ref 70–99)
Potassium: 4.6 mmol/L (ref 3.5–5.2)
Sodium: 139 mmol/L (ref 134–144)
Total Protein: 6.8 g/dL (ref 6.0–8.5)
eGFR: 96 mL/min/{1.73_m2} (ref >59)

## 2022-07-07 LAB — LIPID PANEL W/O CHOL/HDL RATIO
Cholesterol, Total: 169 mg/dL (ref 100–199)
HDL: 39 mg/dL — ABNORMAL LOW (ref >39)
LDL Chol Calc (NIH): 113 mg/dL — ABNORMAL HIGH (ref 0–99)
Triglycerides: 92 mg/dL (ref 0–149)
VLDL Cholesterol Cal: 17 mg/dL (ref 5–40)

## 2022-11-12 ENCOUNTER — Encounter: Payer: Self-pay | Admitting: Family Medicine

## 2022-12-07 ENCOUNTER — Encounter: Payer: BC Managed Care – PPO | Admitting: Family Medicine

## 2022-12-11 ENCOUNTER — Ambulatory Visit (INDEPENDENT_AMBULATORY_CARE_PROVIDER_SITE_OTHER): Payer: BC Managed Care – PPO | Admitting: Family Medicine

## 2022-12-11 ENCOUNTER — Encounter: Payer: Self-pay | Admitting: Family Medicine

## 2022-12-11 VITALS — BP 112/61 | HR 78 | Temp 98.9°F | Resp 14 | Ht 69.02 in | Wt 204.4 lb

## 2022-12-11 DIAGNOSIS — I10 Essential (primary) hypertension: Secondary | ICD-10-CM | POA: Diagnosis not present

## 2022-12-11 DIAGNOSIS — Z0000 Encounter for general adult medical examination without abnormal findings: Secondary | ICD-10-CM

## 2022-12-11 LAB — MICROALBUMIN, URINE WAIVED
Creatinine, Urine Waived: 50 mg/dL (ref 10–300)
Microalb, Ur Waived: 10 mg/L (ref 0–19)

## 2022-12-11 MED ORDER — AMLODIPINE BESYLATE 5 MG PO TABS: 5.0000 mg | ORAL_TABLET | Freq: Every day | ORAL | 1 refills | Status: DC

## 2022-12-11 NOTE — Progress Notes (Signed)
BP 112/61   Pulse 78   Temp 98.9 F (37.2 C) (Oral)   Resp 14   Ht 5' 9.02" (1.753 m)   Wt 204 lb 6.4 oz (92.7 kg)   SpO2 96%   BMI 30.17 kg/m    Subjective:    Patient ID: Chase Brooks, male    DOB: November 21, 1960, 62 y.o.   MRN: [id number]  HPI: Chase Brooks is a 62 y.o. male presenting on 12/11/2022 for comprehensive medical examination. Current medical complaints include:  HYPERTENSION  Hypertension status: controlled  Satisfied with current treatment? yes Duration of hypertension: chronic BP monitoring frequency:  not checking BP medication side effects:  no Medication compliance: excellent compliance Previous BP meds: amlodipine Aspirin: no Recurrent headaches: no Visual changes: no Palpitations: no Dyspnea: no Chest pain: no Lower extremity edema: no Dizzy/lightheaded: no  Interim Problems from his last visit: no  Depression Screen done today and results listed below:     12/11/2022    8:30 AM 07/06/2022    8:33 AM 01/02/2022    8:53 AM 12/04/2021    8:58 AM 08/23/2021    9:25 AM  Depression screen PHQ 2/9  Decreased Interest 0 0 0 0 0  Down, Depressed, Hopeless 0 0 0 0 0  PHQ - 2 Score 0 0 0 0 0  Altered sleeping 0 0 0 0 0  Tired, decreased energy 0 0 0 0 0  Change in appetite 0 0 0 0 0  Feeling bad or failure about yourself  0 0 0 0 0  Trouble concentrating 0 0 0 0 0  Moving slowly or fidgety/restless 0 0 0 0 0  Suicidal thoughts 0 0 0 0 0  PHQ-9 Score 0 0 0 0 0  Difficult doing work/chores Not difficult at all Not difficult at all Not difficult at all Not difficult at all      Past Medical History:  Past Medical History:  Diagnosis Date   Nephrolithiasis    Vertigo     Surgical History:  Past Surgical History:  Procedure Laterality Date   COLONOSCOPY W/ POLYPECTOMY  03/26/2014   polypectomy x 2, repeat 5 years   COLONOSCOPY WITH PROPOFOL N/A 12/21/2019   Procedure: COLONOSCOPY WITH PROPOFOL;  Surgeon: Midge Minium, MD;  Location:  [facility];  Service: Endoscopy;  Laterality: N/A;    Medications:  Current Outpatient Medications on File Prior to Visit  Medication Sig   Multiple Vitamins-Minerals (CENTRUM SILVER 50+MEN) TABS    No current facility-administered medications on file prior to visit.    Allergies:  No Known Allergies  Social History:  Social History   Socioeconomic History   Marital status: Married    Spouse name: Not on file   Number of children: Not on file   Years of education: Not on file   Highest education level: Some college, no degree  Occupational History   Not on file  Tobacco Use   Smoking status: Never   Smokeless tobacco: Never  Vaping Use   Vaping status: Never Used  Substance and Sexual Activity   Alcohol use: No   Drug use: No   Sexual activity: Not on file  Other Topics Concern   Not on file  Social History Narrative   Not on file   Social Determinants of Health   Financial Resource Strain: Low Risk  (12/07/2022)   Overall Financial Resource Strain (CARDIA)    Difficulty of Paying Living Expenses: Not hard at all  Food Insecurity: No Food Insecurity (12/07/2022)   Hunger Vital Sign    Worried About Running Out of Food in the Last Year: Never true    Ran Out of Food in the Last Year: Never true  Transportation Needs: No Transportation Needs (12/07/2022)   PRAPARE - Administrator, Civil Service (Medical): No    Lack of Transportation (Non-Medical): No  Physical Activity: Insufficiently Active (12/07/2022)   Exercise Vital Sign    Days of Exercise per Week: 6 days    Minutes of Exercise per Session: 20 min  Stress: No Stress Concern Present (12/07/2022)   [employer] of Occupational Health - Occupational Stress Questionnaire    Feeling of Stress : Not at all  Social Connections: Unknown (12/07/2022)   Social Connection and Isolation Panel [NHANES]    Frequency of Communication with Friends and Family: More than three times a week     Frequency of Social Gatherings with Friends and Family: Twice a week    Attends Religious Services: More than 4 times per year    Active Member of [employer] or Organizations: Patient declined    Attends Engineer, structural: Not on file    Marital Status: Married  Catering manager Violence: Not on file   Social History   Tobacco Use  Smoking Status Never  Smokeless Tobacco Never   Social History   Substance and Sexual Activity  Alcohol Use No    Family History:  Family History  Problem Relation Age of Onset   Intracerebral hemorrhage Mother    Cancer Neg Hx    COPD Neg Hx    Diabetes Neg Hx    Heart disease Neg Hx    Hypertension Neg Hx    Stroke Neg Hx     Past medical history, surgical history, medications, allergies, family history and social history reviewed with patient today and changes made to appropriate areas of the chart.   Review of Systems  Constitutional: Negative.   HENT:  Positive for ear pain. Negative for congestion, ear discharge, hearing loss, nosebleeds, sinus pain, sore throat and tinnitus.   Eyes: Negative.   Respiratory: Negative.  Negative for stridor.   Cardiovascular:  Positive for leg swelling. Negative for chest pain, palpitations, orthopnea, claudication and PND.  Gastrointestinal: Negative.   Genitourinary: Negative.   Musculoskeletal: Negative.   Skin: Negative.   Neurological: Negative.   Endo/Heme/Allergies:  Negative for environmental allergies and polydipsia. Bruises/bleeds easily.  Psychiatric/Behavioral: Negative.     All other ROS negative except what is listed above and in the HPI.      Objective:    BP 112/61   Pulse 78   Temp 98.9 F (37.2 C) (Oral)   Resp 14   Ht 5' 9.02" (1.753 m)   Wt 204 lb 6.4 oz (92.7 kg)   SpO2 96%   BMI 30.17 kg/m   Wt Readings from Last 3 Encounters:  12/11/22 204 lb 6.4 oz (92.7 kg)  07/06/22 197 lb 12.8 oz (89.7 kg)  01/02/22 196 lb 1.6 oz (89 kg)    Physical Exam Vitals and  nursing note reviewed.  Constitutional:      General: He is not in acute distress.    Appearance: Normal appearance. He is not ill-appearing, toxic-appearing or diaphoretic.  HENT:     Head: Normocephalic and atraumatic.     Right Ear: Tympanic membrane, ear canal and external ear normal. There is no impacted cerumen.     Left Ear: Tympanic  membrane, ear canal and external ear normal. There is no impacted cerumen.     Nose: Nose normal. No congestion or rhinorrhea.     Mouth/Throat:     Mouth: Mucous membranes are moist.     Pharynx: Oropharynx is clear. No oropharyngeal exudate or posterior oropharyngeal erythema.  Eyes:     General: No scleral icterus.       Right eye: No discharge.        Left eye: No discharge.     Extraocular Movements: Extraocular movements intact.     Conjunctiva/sclera: Conjunctivae normal.     Pupils: Pupils are equal, round, and reactive to light.  Neck:     Vascular: No carotid bruit.  Cardiovascular:     Rate and Rhythm: Normal rate and regular rhythm.     Pulses: Normal pulses.     Heart sounds: No murmur heard.    No friction rub. No gallop.  Pulmonary:     Effort: Pulmonary effort is normal. No respiratory distress.     Breath sounds: Normal breath sounds. No stridor. No wheezing, rhonchi or rales.  Chest:     Chest wall: No tenderness.  Abdominal:     General: Abdomen is flat. Bowel sounds are normal. There is no distension.     Palpations: Abdomen is soft. There is no mass.     Tenderness: There is no abdominal tenderness. There is no right CVA tenderness, left CVA tenderness, guarding or rebound.     Hernia: No hernia is present.  Genitourinary:    Comments: Breast and pelvic exams deferred with shared decision making Musculoskeletal:        General: No swelling, tenderness, deformity or signs of injury.     Cervical back: Normal range of motion and neck supple. No rigidity. No muscular tenderness.     Right lower leg: No edema.     Left  lower leg: No edema.  Lymphadenopathy:     Cervical: No cervical adenopathy.  Skin:    General: Skin is warm and dry.     Capillary Refill: Capillary refill takes less than 2 seconds.     Coloration: Skin is not jaundiced or pale.     Findings: No bruising, erythema, lesion or rash.  Neurological:     General: No focal deficit present.     Mental Status: He is alert and oriented to person, place, and time. Mental status is at baseline.     Cranial Nerves: No cranial nerve deficit.     Sensory: No sensory deficit.     Motor: No weakness.     Coordination: Coordination normal.     Gait: Gait normal.     Deep Tendon Reflexes: Reflexes normal.  Psychiatric:        Mood and Affect: Mood normal.        Behavior: Behavior normal.        Thought Content: Thought content normal.        Judgment: Judgment normal.     Results for orders placed or performed in visit on 07/06/22  Comprehensive metabolic panel  Result Value Ref Range   Glucose 80 70 - 99 mg/dL   BUN 10 8 - 27 mg/dL   Creatinine, Ser 7.82 0.76 - 1.27 mg/dL   eGFR 96 >95 AO/ZHY/8.65   BUN/Creatinine Ratio 11 10 - 24   Sodium 139 134 - 144 mmol/L   Potassium 4.6 3.5 - 5.2 mmol/L   Chloride 103 96 - 106 mmol/L  CO2 22 20 - 29 mmol/L   Calcium 8.9 8.6 - 10.2 mg/dL   Total Protein 6.8 6.0 - 8.5 g/dL   Albumin 4.2 3.8 - 4.9 g/dL   Globulin, Total 2.6 1.5 - 4.5 g/dL   Bilirubin Total 0.5 0.0 - 1.2 mg/dL   Alkaline Phosphatase 115 44 - 121 IU/L   AST 17 0 - 40 IU/L   ALT 14 0 - 44 IU/L  Lipid Panel w/o Chol/HDL Ratio  Result Value Ref Range   Cholesterol, Total 169 100 - 199 mg/dL   Triglycerides 92 0 - 149 mg/dL   HDL 39 (L) >16 mg/dL   VLDL Cholesterol Cal 17 5 - 40 mg/dL   LDL Chol Calc (NIH) 010 (H) 0 - 99 mg/dL      Assessment & Plan:   Problem List Items Addressed This Visit       Cardiovascular and Mediastinum   Essential hypertension    Under good control on current regimen. Continue current regimen.  Continue to monitor. Call with any concerns. Refills given. Labs drawn today.        Relevant Medications   amLODipine (NORVASC) 5 MG tablet   Other Relevant Orders   Microalbumin, Urine Waived   Other Visit Diagnoses     Routine general medical examination at a health care facility    -  Primary   Vaccines up to date/declined. Screening labs checked today. Colonoscopy up to date. Continue diet and exercise. Call with any concerns.   Relevant Orders   Comprehensive metabolic panel   CBC with Differential/Platelet   Lipid Panel w/o Chol/HDL Ratio   PSA   TSH        Discussed aspirin prophylaxis for myocardial infarction prevention and decision was it was not indicated  LABORATORY TESTING:  Health maintenance labs ordered today as discussed above.   The natural history of prostate cancer and ongoing controversy regarding screening and potential treatment outcomes of prostate cancer has been discussed with the patient. The meaning of a false positive PSA and a false negative PSA has been discussed. He indicates understanding of the limitations of this screening test and wishes to proceed with screening PSA testing.   IMMUNIZATIONS:   - Tdap: Tetanus vaccination status reviewed: last tetanus booster within 10 years. - Influenza: Refused - Pneumovax: Not applicable - Prevnar: Not applicable - COVID: Refused - HPV: Not applicable - Shingrix vaccine: Refused  SCREENING: - Colonoscopy: Up to date  Discussed with patient purpose of the colonoscopy is to detect colon cancer at curable precancerous or early stages    PATIENT COUNSELING:    Sexuality: Discussed sexually transmitted diseases, partner selection, use of condoms, avoidance of unintended pregnancy  and contraceptive alternatives.   Advised to avoid cigarette smoking.  I discussed with the patient that most people either abstain from alcohol or drink within safe limits (<=14/week and <=4 drinks/occasion for males,  <=7/weeks and <= 3 drinks/occasion for females) and that the risk for alcohol disorders and other health effects rises proportionally with the number of drinks per week and how often a drinker exceeds daily limits.  Discussed cessation/primary prevention of drug use and availability of treatment for abuse.   Diet: Encouraged to adjust caloric intake to maintain  or achieve ideal body weight, to reduce intake of dietary saturated fat and total fat, to limit sodium intake by avoiding high sodium foods and not adding table salt, and to maintain adequate dietary potassium and calcium preferably from fresh fruits, vegetables,  and low-fat dairy products.    stressed the importance of regular exercise  Injury prevention: Discussed safety belts, safety helmets, smoke detector, smoking near bedding or upholstery.   Dental health: Discussed importance of regular tooth brushing, flossing, and dental visits.   Follow up plan: NEXT PREVENTATIVE PHYSICAL DUE IN 1 YEAR. Return in about 6 months (around 06/10/2023).

## 2022-12-11 NOTE — Assessment & Plan Note (Signed)
Under good control on current regimen. Continue current regimen. Continue to monitor. Call with any concerns. Refills given. Labs drawn today.   

## 2022-12-12 LAB — CBC WITH DIFFERENTIAL/PLATELET
Basophils Absolute: 0.1 10*3/uL (ref 0.0–0.2)
Basos: 1 %
EOS (ABSOLUTE): 0.1 10*3/uL (ref 0.0–0.4)
Eos: 1 %
Hematocrit: 46.9 % (ref 37.5–51.0)
Hemoglobin: 15.7 g/dL (ref 13.0–17.7)
Immature Grans (Abs): 0 10*3/uL (ref 0.0–0.1)
Immature Granulocytes: 0 %
Lymphocytes Absolute: 2 10*3/uL (ref 0.7–3.1)
Lymphs: 24 %
MCH: 32.2 pg (ref 26.6–33.0)
MCHC: 33.5 g/dL (ref 31.5–35.7)
MCV: 96 fL (ref 79–97)
Monocytes Absolute: 0.6 10*3/uL (ref 0.1–0.9)
Monocytes: 8 %
Neutrophils Absolute: 5.5 10*3/uL (ref 1.4–7.0)
Neutrophils: 66 %
Platelets: 243 10*3/uL (ref 150–450)
RBC: 4.87 x10E6/uL (ref 4.14–5.80)
RDW: 12.5 % (ref 11.6–15.4)
WBC: 8.3 10*3/uL (ref 3.4–10.8)

## 2022-12-12 LAB — LIPID PANEL W/O CHOL/HDL RATIO
Cholesterol, Total: 176 mg/dL (ref 100–199)
HDL: 39 mg/dL — ABNORMAL LOW (ref >39)
LDL Chol Calc (NIH): 116 mg/dL — ABNORMAL HIGH (ref 0–99)
Triglycerides: 118 mg/dL (ref 0–149)
VLDL Cholesterol Cal: 21 mg/dL (ref 5–40)

## 2022-12-12 LAB — COMPREHENSIVE METABOLIC PANEL
ALT: 14 [IU]/L (ref 0–44)
AST: 22 [IU]/L (ref 0–40)
Albumin: 4 g/dL (ref 3.8–4.9)
Alkaline Phosphatase: 122 [IU]/L — ABNORMAL HIGH (ref 44–121)
BUN/Creatinine Ratio: 13 (ref 10–24)
BUN: 11 mg/dL (ref 8–27)
Bilirubin Total: 0.4 mg/dL (ref 0.0–1.2)
CO2: 23 mmol/L (ref 20–29)
Calcium: 8.8 mg/dL (ref 8.6–10.2)
Chloride: 103 mmol/L (ref 96–106)
Creatinine, Ser: 0.88 mg/dL (ref 0.76–1.27)
Globulin, Total: 2.9 g/dL (ref 1.5–4.5)
Glucose: 72 mg/dL (ref 70–99)
Potassium: 4.4 mmol/L (ref 3.5–5.2)
Sodium: 139 mmol/L (ref 134–144)
Total Protein: 6.9 g/dL (ref 6.0–8.5)
eGFR: 98 mL/min/{1.73_m2} (ref >59)

## 2022-12-12 LAB — TSH: TSH: 2.3 u[IU]/mL (ref 0.450–4.500)

## 2022-12-12 LAB — PSA: Prostate Specific Ag, Serum: 0.6 ng/mL (ref 0.0–4.0)

## 2023-06-11 ENCOUNTER — Encounter: Payer: Self-pay | Admitting: Family Medicine

## 2023-06-11 ENCOUNTER — Ambulatory Visit: Payer: Self-pay | Admitting: Family Medicine

## 2023-06-11 VITALS — BP 134/78 | HR 69 | Temp 98.0°F | Ht 69.0 in | Wt 206.2 lb

## 2023-06-11 DIAGNOSIS — K219 Gastro-esophageal reflux disease without esophagitis: Secondary | ICD-10-CM

## 2023-06-11 DIAGNOSIS — I10 Essential (primary) hypertension: Secondary | ICD-10-CM

## 2023-06-11 DIAGNOSIS — E782 Mixed hyperlipidemia: Secondary | ICD-10-CM

## 2023-06-11 MED ORDER — AMLODIPINE BESYLATE 5 MG PO TABS: 5.0000 mg | ORAL_TABLET | Freq: Every day | ORAL | 1 refills | Status: DC

## 2023-06-11 NOTE — Progress Notes (Signed)
 BP 134/78   Pulse 69   Temp 98 F (36.7 C) (Oral)   Ht 5\' 9"  (1.753 m)   Wt 206 lb 3.2 oz (93.5 kg)   SpO2 99%   BMI 30.45 kg/m    Subjective:    Patient ID: Chase Brooks, male    DOB: 05-16-1960, 63 y.o.   MRN: [id number]  HPI: Chase Brooks is a 63 y.o. male  Chief Complaint  Patient presents with   Hypertension    6 month f/u   HYPERTENSION / HYPERLIPIDEMIA Satisfied with current treatment? yes Duration of hypertension: chronic BP monitoring frequency: not checking BP medication side effects: no Past BP meds: amlodipine  Duration of hyperlipidemia: chronic Cholesterol medication side effects: no Cholesterol supplements: none Past cholesterol medications: none Medication compliance: excellent compliance Aspirin: no Recent stressors: no Recurrent headaches: no Visual changes: no Palpitations: no Dyspnea: no Chest pain: no Lower extremity edema: no Dizzy/lightheaded: no  ABDOMINAL PAIN  Duration:few weeks Onset: sudden Severity: mild Quality: sore, burning Location:  epigastric  Episode duration: in the morning after he has eaten later Radiation: no Frequency: intermittent Status: worse Treatments attempted: none Fever: no Nausea: no Vomiting: no Weight loss: no Decreased appetite: no Diarrhea: no Constipation: no Blood in stool: no Heartburn: yes Jaundice: no Rash: no Dysuria/urinary frequency: no Hematuria: no History of sexually transmitted disease: no Recurrent NSAID use: no   Relevant past medical, surgical, family and social history reviewed and updated as indicated. Interim medical history since our last visit reviewed. Allergies and medications reviewed and updated.  Review of Systems  Constitutional: Negative.   Respiratory: Negative.    Cardiovascular: Negative.   Musculoskeletal: Negative.   Neurological: Negative.   Psychiatric/Behavioral: Negative.      Per HPI unless specifically indicated above      Objective:     BP 134/78   Pulse 69   Temp 98 F (36.7 C) (Oral)   Ht 5\' 9"  (1.753 m)   Wt 206 lb 3.2 oz (93.5 kg)   SpO2 99%   BMI 30.45 kg/m   Wt Readings from Last 3 Encounters:  06/11/23 206 lb 3.2 oz (93.5 kg)  12/11/22 204 lb 6.4 oz (92.7 kg)  07/06/22 197 lb 12.8 oz (89.7 kg)    Physical Exam Vitals and nursing note reviewed.  Constitutional:      General: He is not in acute distress.    Appearance: Normal appearance. He is not ill-appearing, toxic-appearing or diaphoretic.  HENT:     Head: Normocephalic and atraumatic.     Right Ear: External ear normal.     Left Ear: External ear normal.     Nose: Nose normal.     Mouth/Throat:     Mouth: Mucous membranes are moist.     Pharynx: Oropharynx is clear.  Eyes:     General: No scleral icterus.       Right eye: No discharge.        Left eye: No discharge.     Extraocular Movements: Extraocular movements intact.     Conjunctiva/sclera: Conjunctivae normal.     Pupils: Pupils are equal, round, and reactive to light.  Cardiovascular:     Rate and Rhythm: Normal rate and regular rhythm.     Pulses: Normal pulses.     Heart sounds: Normal heart sounds. No murmur heard.    No friction rub. No gallop.  Pulmonary:     Effort: Pulmonary effort is normal. No respiratory distress.  Breath sounds: Normal breath sounds. No stridor. No wheezing, rhonchi or rales.  Chest:     Chest wall: No tenderness.  Musculoskeletal:        General: Normal range of motion.     Cervical back: Normal range of motion and neck supple.  Skin:    General: Skin is warm and dry.     Capillary Refill: Capillary refill takes less than 2 seconds.     Coloration: Skin is not jaundiced or pale.     Findings: No bruising, erythema, lesion or rash.  Neurological:     General: No focal deficit present.     Mental Status: He is alert and oriented to person, place, and time. Mental status is at baseline.  Psychiatric:        Mood and Affect: Mood  normal.        Behavior: Behavior normal.        Thought Content: Thought content normal.        Judgment: Judgment normal.     Results for orders placed or performed in visit on 12/11/22  Microalbumin, Urine Waived   Collection Time: 12/11/22  8:57 AM  Result Value Ref Range   Microalb, Ur Waived 10 0 - 19 mg/L   Creatinine, Urine Waived 50 10 - 300 mg/dL   Microalb/Creat Ratio 30-300 (H) <30 mg/g  Comprehensive metabolic panel   Collection Time: 12/11/22  9:00 AM  Result Value Ref Range   Glucose 72 70 - 99 mg/dL   BUN 11 8 - 27 mg/dL   Creatinine, Ser 5.63 0.76 - 1.27 mg/dL   eGFR 98 >87 FI/EPP/2.95   BUN/Creatinine Ratio 13 10 - 24   Sodium 139 134 - 144 mmol/L   Potassium 4.4 3.5 - 5.2 mmol/L   Chloride 103 96 - 106 mmol/L   CO2 23 20 - 29 mmol/L   Calcium 8.8 8.6 - 10.2 mg/dL   Total Protein 6.9 6.0 - 8.5 g/dL   Albumin 4.0 3.8 - 4.9 g/dL   Globulin, Total 2.9 1.5 - 4.5 g/dL   Bilirubin Total 0.4 0.0 - 1.2 mg/dL   Alkaline Phosphatase 122 (H) 44 - 121 IU/L   AST 22 0 - 40 IU/L   ALT 14 0 - 44 IU/L  CBC with Differential/Platelet   Collection Time: 12/11/22  9:00 AM  Result Value Ref Range   WBC 8.3 3.4 - 10.8 x10E3/uL   RBC 4.87 4.14 - 5.80 x10E6/uL   Hemoglobin 15.7 13.0 - 17.7 g/dL   Hematocrit 18.8 41.6 - 51.0 %   MCV 96 79 - 97 fL   MCH 32.2 26.6 - 33.0 pg   MCHC 33.5 31.5 - 35.7 g/dL   RDW 60.6 30.1 - 60.1 %   Platelets 243 150 - 450 x10E3/uL   Neutrophils 66 Not Estab. %   Lymphs 24 Not Estab. %   Monocytes 8 Not Estab. %   Eos 1 Not Estab. %   Basos 1 Not Estab. %   Neutrophils Absolute 5.5 1.4 - 7.0 x10E3/uL   Lymphocytes Absolute 2.0 0.7 - 3.1 x10E3/uL   Monocytes Absolute 0.6 0.1 - 0.9 x10E3/uL   EOS (ABSOLUTE) 0.1 0.0 - 0.4 x10E3/uL   Basophils Absolute 0.1 0.0 - 0.2 x10E3/uL   Immature Granulocytes 0 Not Estab. %   Immature Grans (Abs) 0.0 0.0 - 0.1 x10E3/uL  Lipid Panel w/o Chol/HDL Ratio   Collection Time: 12/11/22  9:00 AM  Result Value  Ref Range   Cholesterol,  Total 176 100 - 199 mg/dL   Triglycerides 960 0 - 149 mg/dL   HDL 39 (L) >45 mg/dL   VLDL Cholesterol Cal 21 5 - 40 mg/dL   LDL Chol Calc (NIH) 409 (H) 0 - 99 mg/dL  PSA   Collection Time: 12/11/22  9:00 AM  Result Value Ref Range   Prostate Specific Ag, Serum 0.6 0.0 - 4.0 ng/mL  TSH   Collection Time: 12/11/22  9:00 AM  Result Value Ref Range   TSH 2.300 0.450 - 4.500 uIU/mL      Assessment & Plan:   Problem List Items Addressed This Visit       Cardiovascular and Mediastinum   Essential hypertension - Primary   Under good control on current regimen. Continue current regimen. Continue to monitor. Call with any concerns. Refills given. Labs drawn today.        Relevant Medications   amLODipine  (NORVASC ) 5 MG tablet   Other Relevant Orders   Comprehensive metabolic panel with GFR     Other   Mixed hyperlipidemia   Rechecking labs today. Await results. Treat as needed.       Relevant Medications   amLODipine  (NORVASC ) 5 MG tablet   Other Relevant Orders   Comprehensive metabolic panel with GFR   Lipid Panel w/o Chol/HDL Ratio   Other Visit Diagnoses       Gastroesophageal reflux disease without esophagitis       Discussed lifestyle changes such as not eating late and raising the head of his bed. If worsening, consider PPI. Call with any concerns.   Relevant Orders   CBC with Differential/Platelet        Follow up plan: Return in about 6 months (around 12/12/2023) for physical.

## 2023-06-11 NOTE — Assessment & Plan Note (Signed)
 Under good control on current regimen. Continue current regimen. Continue to monitor. Call with any concerns. Refills given. Labs drawn today.

## 2023-06-11 NOTE — Assessment & Plan Note (Signed)
 Rechecking labs today. Await results. Treat as needed.

## 2023-06-12 ENCOUNTER — Ambulatory Visit: Payer: Self-pay | Admitting: Family Medicine

## 2023-06-12 LAB — CBC WITH DIFFERENTIAL/PLATELET
Basophils Absolute: 0 10*3/uL (ref 0.0–0.2)
Basos: 1 %
EOS (ABSOLUTE): 0.1 10*3/uL (ref 0.0–0.4)
Eos: 1 %
Hematocrit: 46.9 % (ref 37.5–51.0)
Hemoglobin: 15.6 g/dL (ref 13.0–17.7)
Immature Grans (Abs): 0 10*3/uL (ref 0.0–0.1)
Immature Granulocytes: 0 %
Lymphocytes Absolute: 2.1 10*3/uL (ref 0.7–3.1)
Lymphs: 27 %
MCH: 32 pg (ref 26.6–33.0)
MCHC: 33.3 g/dL (ref 31.5–35.7)
MCV: 96 fL (ref 79–97)
Monocytes Absolute: 0.6 10*3/uL (ref 0.1–0.9)
Monocytes: 8 %
Neutrophils Absolute: 4.9 10*3/uL (ref 1.4–7.0)
Neutrophils: 63 %
Platelets: 210 10*3/uL (ref 150–450)
RBC: 4.87 x10E6/uL (ref 4.14–5.80)
RDW: 12.8 % (ref 11.6–15.4)
WBC: 7.7 10*3/uL (ref 3.4–10.8)

## 2023-06-12 LAB — COMPREHENSIVE METABOLIC PANEL WITH GFR
ALT: 15 IU/L (ref 0–44)
AST: 20 IU/L (ref 0–40)
Albumin: 4.1 g/dL (ref 3.9–4.9)
Alkaline Phosphatase: 119 IU/L (ref 44–121)
BUN/Creatinine Ratio: 12 (ref 10–24)
BUN: 9 mg/dL (ref 8–27)
Bilirubin Total: 0.4 mg/dL (ref 0.0–1.2)
CO2: 20 mmol/L (ref 20–29)
Calcium: 8.7 mg/dL (ref 8.6–10.2)
Chloride: 102 mmol/L (ref 96–106)
Creatinine, Ser: 0.76 mg/dL (ref 0.76–1.27)
Globulin, Total: 2.7 g/dL (ref 1.5–4.5)
Glucose: 79 mg/dL (ref 70–99)
Potassium: 4.3 mmol/L (ref 3.5–5.2)
Sodium: 138 mmol/L (ref 134–144)
Total Protein: 6.8 g/dL (ref 6.0–8.5)
eGFR: 102 mL/min/{1.73_m2} (ref >59)

## 2023-06-12 LAB — LIPID PANEL W/O CHOL/HDL RATIO
Cholesterol, Total: 178 mg/dL (ref 100–199)
HDL: 43 mg/dL (ref >39)
LDL Chol Calc (NIH): 120 mg/dL — ABNORMAL HIGH (ref 0–99)
Triglycerides: 81 mg/dL (ref 0–149)
VLDL Cholesterol Cal: 15 mg/dL (ref 5–40)

## 2023-12-16 ENCOUNTER — Encounter: Admitting: Family Medicine

## 2024-01-30 ENCOUNTER — Ambulatory Visit (INDEPENDENT_AMBULATORY_CARE_PROVIDER_SITE_OTHER): Admitting: Family Medicine

## 2024-01-30 ENCOUNTER — Encounter: Payer: Self-pay | Admitting: Family Medicine

## 2024-01-30 VITALS — BP 138/78 | HR 82 | Temp 98.0°F | Ht 69.0 in | Wt 206.2 lb

## 2024-01-30 DIAGNOSIS — E782 Mixed hyperlipidemia: Secondary | ICD-10-CM

## 2024-01-30 DIAGNOSIS — I10 Essential (primary) hypertension: Secondary | ICD-10-CM | POA: Diagnosis not present

## 2024-01-30 DIAGNOSIS — Z0000 Encounter for general adult medical examination without abnormal findings: Secondary | ICD-10-CM | POA: Diagnosis not present

## 2024-01-30 LAB — MICROALBUMIN, URINE WAIVED
Creatinine, Urine Waived: 50 mg/dL (ref 10–300)
Microalb, Ur Waived: 10 mg/L (ref 0–19)

## 2024-01-30 MED ORDER — AMLODIPINE BESYLATE 5 MG PO TABS: 5.0000 mg | ORAL_TABLET | Freq: Every day | ORAL | 1 refills | Status: AC

## 2024-01-30 NOTE — Assessment & Plan Note (Signed)
 Will get back on his medicine. Call with any concerns. Continue to monitor.

## 2024-01-30 NOTE — Progress Notes (Signed)
 "  BP 138/78   Pulse 82   Temp 98 F (36.7 C) (Oral)   Ht 5' 9 (1.753 m)   Wt 206 lb 3.2 oz (93.5 kg)   SpO2 98%   BMI 30.45 kg/m    Subjective:    Patient ID: Chase Brooks, male    DOB: 09/07/1960, 64 y.o.   MRN: [id number]  HPI: Chase Brooks is a 64 y.o. male presenting on 01/30/2024 for comprehensive medical examination. Current medical complaints include:  HYPERTENSION  Hypertension status: has been out of his medicine for about 2 weeks.   Satisfied with current treatment? yes Duration of hypertension: chronic BP monitoring frequency:  not checking BP medication side effects:  no Medication compliance: good compliance Previous BP meds:amlodipine  Aspirin: no Recurrent headaches: no Visual changes: no Palpitations: no Dyspnea: no Chest pain: no Lower extremity edema: no Dizzy/lightheaded: no  Interim Problems from his last visit: no  Depression Screen done today and results listed below:     01/30/2024    8:11 AM 06/11/2023    8:05 AM 12/11/2022    8:30 AM 07/06/2022    8:33 AM 01/02/2022    8:53 AM  Depression screen PHQ 2/9  Decreased Interest 0 0 0 0 0  Down, Depressed, Hopeless 0 0 0 0 0  PHQ - 2 Score 0 0 0 0 0  Altered sleeping 0 0 0 0 0  Tired, decreased energy 0 0 0 0 0  Change in appetite 0 0 0 0 0  Feeling bad or failure about yourself  0 0 0 0 0  Trouble concentrating 0 0 0 0 0  Moving slowly or fidgety/restless 0 0 0 0 0  Suicidal thoughts 0 0 0 0 0  PHQ-9 Score 0 0  0  0  0   Difficult doing work/chores   Not difficult at all Not difficult at all Not difficult at all     Data saved with a previous flowsheet row definition    Past Medical History:  Past Medical History:  Diagnosis Date   Arthritis Oct 2022   Nephrolithiasis    Vertigo     Surgical History:  Past Surgical History:  Procedure Laterality Date   COLONOSCOPY W/ POLYPECTOMY  03/26/2014   polypectomy x 2, repeat 5 years   COLONOSCOPY WITH PROPOFOL  N/A 12/21/2019    Procedure: COLONOSCOPY WITH PROPOFOL ;  Surgeon: Jinny Carmine, MD;  Location: [facility];  Service: Endoscopy;  Laterality: N/A;    Medications:  Current Outpatient Medications on File Prior to Visit  Medication Sig   Ascorbic Acid (VITAMIN C) 1000 MG tablet    No current facility-administered medications on file prior to visit.    Allergies:  Allergies[1]  Social History:  Social History   Socioeconomic History   Marital status: Married    Spouse name: Not on file   Number of children: Not on file   Years of education: Not on file   Highest education level: Some college, no degree  Occupational History   Not on file  Tobacco Use   Smoking status: Never   Smokeless tobacco: Never  Vaping Use   Vaping status: Never Used  Substance and Sexual Activity   Alcohol use: Never   Drug use: Never   Sexual activity: Yes    Birth control/protection: Condom  Other Topics Concern   Not on file  Social History Narrative   Not on file   Social Drivers of Health   Tobacco  Use: Low Risk (01/30/2024)   Patient History    Smoking Tobacco Use: Never    Smokeless Tobacco Use: Never    Passive Exposure: Not on file  Financial Resource Strain: Low Risk (01/26/2024)   Overall Financial Resource Strain (CARDIA)    Difficulty of Paying Living Expenses: Not very hard  Food Insecurity: No Food Insecurity (01/26/2024)   Epic    Worried About Programme Researcher, Broadcasting/film/video in the Last Year: Never true    Ran Out of Food in the Last Year: Never true  Transportation Needs: No Transportation Needs (01/26/2024)   Epic    Lack of Transportation (Medical): No    Lack of Transportation (Non-Medical): No  Physical Activity: Insufficiently Active (01/26/2024)   Exercise Vital Sign    Days of Exercise per Week: 4 days    Minutes of Exercise per Session: 30 min  Stress: Stress Concern Present (01/26/2024)   [employer] of Occupational Health - Occupational Stress Questionnaire    Feeling of  Stress: To some extent  Social Connections: Socially Integrated (01/26/2024)   Social Connection and Isolation Panel    Frequency of Communication with Friends and Family: More than three times a week    Frequency of Social Gatherings with Friends and Family: Twice a week    Attends Religious Services: More than 4 times per year    Active Member of Clubs or Organizations: Yes    Attends Engineer, Structural: More than 4 times per year    Marital Status: Married  Catering Manager Violence: Not on file  Depression (PHQ2-9): Low Risk (01/30/2024)   Depression (PHQ2-9)    PHQ-2 Score: 0  Alcohol Screen: Low Risk (12/11/2022)   Alcohol Screen    Last Alcohol Screening Score (AUDIT): 0  Housing: Low Risk (01/30/2024)   Epic    Unable to Pay for Housing in the Last Year: No    Number of Times Moved in the Last Year: 0    Homeless in the Last Year: No  Utilities: Not At Risk (12/11/2022)   [facility] Utilities    Threatened with loss of utilities: No  Health Literacy: Adequate Health Literacy (12/11/2022)   B1300 Health Literacy    Frequency of need for help with medical instructions: Never   Tobacco Use History[2] Social History   Substance and Sexual Activity  Alcohol Use Never    Family History:  Family History  Problem Relation Age of Onset   Intracerebral hemorrhage Mother    Stroke Mother    Cancer Neg Hx    COPD Neg Hx    Diabetes Neg Hx    Heart disease Neg Hx    Hypertension Neg Hx     Past medical history, surgical history, medications, allergies, family history and social history reviewed with patient today and changes made to appropriate areas of the chart.   Review of Systems  Constitutional: Negative.   HENT: Negative.    Eyes: Negative.   Respiratory: Negative.    Cardiovascular: Negative.   Gastrointestinal:  Positive for heartburn (with food choices). Negative for abdominal pain, blood in stool, constipation, diarrhea, melena, nausea and vomiting.   Genitourinary: Negative.   Musculoskeletal: Negative.   Skin: Negative.   Neurological: Negative.   Endo/Heme/Allergies: Negative.   Psychiatric/Behavioral: Negative.     All other ROS negative except what is listed above and in the HPI.      Objective:    BP 138/78   Pulse 82   Temp 98 F (  36.7 C) (Oral)   Ht 5' 9 (1.753 m)   Wt 206 lb 3.2 oz (93.5 kg)   SpO2 98%   BMI 30.45 kg/m   Wt Readings from Last 3 Encounters:  01/30/24 206 lb 3.2 oz (93.5 kg)  06/11/23 206 lb 3.2 oz (93.5 kg)  12/11/22 204 lb 6.4 oz (92.7 kg)    Physical Exam Vitals and nursing note reviewed.  Constitutional:      General: He is not in acute distress.    Appearance: Normal appearance. He is obese. He is not ill-appearing, toxic-appearing or diaphoretic.  HENT:     Head: Normocephalic and atraumatic.     Right Ear: Tympanic membrane, ear canal and external ear normal. There is no impacted cerumen.     Left Ear: Tympanic membrane, ear canal and external ear normal. There is no impacted cerumen.     Nose: Nose normal. No congestion or rhinorrhea.     Mouth/Throat:     Mouth: Mucous membranes are moist.     Pharynx: Oropharynx is clear. No oropharyngeal exudate or posterior oropharyngeal erythema.  Eyes:     General: No scleral icterus.       Right eye: No discharge.        Left eye: No discharge.     Extraocular Movements: Extraocular movements intact.     Conjunctiva/sclera: Conjunctivae normal.     Pupils: Pupils are equal, round, and reactive to light.  Neck:     Vascular: No carotid bruit.  Cardiovascular:     Rate and Rhythm: Normal rate and regular rhythm.     Pulses: Normal pulses.     Heart sounds: No murmur heard.    No friction rub. No gallop.  Pulmonary:     Effort: Pulmonary effort is normal. No respiratory distress.     Breath sounds: Normal breath sounds. No stridor. No wheezing, rhonchi or rales.  Chest:     Chest wall: No tenderness.  Abdominal:     General: Abdomen  is flat. Bowel sounds are normal. There is no distension.     Palpations: Abdomen is soft. There is no mass.     Tenderness: There is no abdominal tenderness. There is no right CVA tenderness, left CVA tenderness, guarding or rebound.     Hernia: No hernia is present.  Genitourinary:    Comments: Genital exam deferred with shared decision making Musculoskeletal:        General: No swelling, tenderness, deformity or signs of injury.     Cervical back: Normal range of motion and neck supple. No rigidity. No muscular tenderness.     Right lower leg: No edema.     Left lower leg: No edema.  Lymphadenopathy:     Cervical: No cervical adenopathy.  Skin:    General: Skin is warm and dry.     Capillary Refill: Capillary refill takes less than 2 seconds.     Coloration: Skin is not jaundiced or pale.     Findings: No bruising, erythema, lesion or rash.  Neurological:     General: No focal deficit present.     Mental Status: He is alert and oriented to person, place, and time.     Cranial Nerves: No cranial nerve deficit.     Sensory: No sensory deficit.     Motor: No weakness.     Coordination: Coordination normal.     Gait: Gait normal.     Deep Tendon Reflexes: Reflexes normal.  Psychiatric:  Mood and Affect: Mood normal.        Behavior: Behavior normal.        Thought Content: Thought content normal.        Judgment: Judgment normal.     Results for orders placed or performed in visit on 06/11/23  Comprehensive metabolic panel with GFR   Collection Time: 06/11/23  8:16 AM  Result Value Ref Range   Glucose 79 70 - 99 mg/dL   BUN 9 8 - 27 mg/dL   Creatinine, Ser 9.23 0.76 - 1.27 mg/dL   eGFR 897 >40 fO/fpw/8.26   BUN/Creatinine Ratio 12 10 - 24   Sodium 138 134 - 144 mmol/L   Potassium 4.3 3.5 - 5.2 mmol/L   Chloride 102 96 - 106 mmol/L   CO2 20 20 - 29 mmol/L   Calcium 8.7 8.6 - 10.2 mg/dL   Total Protein 6.8 6.0 - 8.5 g/dL   Albumin 4.1 3.9 - 4.9 g/dL   Globulin,  Total 2.7 1.5 - 4.5 g/dL   Bilirubin Total 0.4 0.0 - 1.2 mg/dL   Alkaline Phosphatase 119 44 - 121 IU/L   AST 20 0 - 40 IU/L   ALT 15 0 - 44 IU/L  CBC with Differential/Platelet   Collection Time: 06/11/23  8:16 AM  Result Value Ref Range   WBC 7.7 3.4 - 10.8 x10E3/uL   RBC 4.87 4.14 - 5.80 x10E6/uL   Hemoglobin 15.6 13.0 - 17.7 g/dL   Hematocrit 53.0 62.4 - 51.0 %   MCV 96 79 - 97 fL   MCH 32.0 26.6 - 33.0 pg   MCHC 33.3 31.5 - 35.7 g/dL   RDW 87.1 88.3 - 84.5 %   Platelets 210 150 - 450 x10E3/uL   Neutrophils 63 Not Estab. %   Lymphs 27 Not Estab. %   Monocytes 8 Not Estab. %   Eos 1 Not Estab. %   Basos 1 Not Estab. %   Neutrophils Absolute 4.9 1.4 - 7.0 x10E3/uL   Lymphocytes Absolute 2.1 0.7 - 3.1 x10E3/uL   Monocytes Absolute 0.6 0.1 - 0.9 x10E3/uL   EOS (ABSOLUTE) 0.1 0.0 - 0.4 x10E3/uL   Basophils Absolute 0.0 0.0 - 0.2 x10E3/uL   Immature Granulocytes 0 Not Estab. %   Immature Grans (Abs) 0.0 0.0 - 0.1 x10E3/uL  Lipid Panel w/o Chol/HDL Ratio   Collection Time: 06/11/23  8:16 AM  Result Value Ref Range   Cholesterol, Total 178 100 - 199 mg/dL   Triglycerides 81 0 - 149 mg/dL   HDL 43 >60 mg/dL   VLDL Cholesterol Cal 15 5 - 40 mg/dL   LDL Chol Calc (NIH) 879 (H) 0 - 99 mg/dL      Assessment & Plan:   Problem List Items Addressed This Visit       Cardiovascular and Mediastinum   Essential hypertension   Will get back on his medicine. Call with any concerns. Continue to monitor.       Relevant Medications   amLODipine  (NORVASC ) 5 MG tablet   Other Relevant Orders   Microalbumin, Urine Waived     Other   Mixed hyperlipidemia   Rechecking labs today. Await results. Treat as needed.       Relevant Medications   amLODipine  (NORVASC ) 5 MG tablet   Other Visit Diagnoses       Routine general medical examination at a health care facility    -  Primary   Vaccines declined. Screening labs checked today. Colonoscopy up  to date. Continue diet and exercise.  Call with any concerns.   Relevant Orders   Comprehensive metabolic panel with GFR   CBC with Differential/Platelet   Lipid Panel w/o Chol/HDL Ratio   PSA   TSH        LABORATORY TESTING:  Health maintenance labs ordered today as discussed above.   The natural history of prostate cancer and ongoing controversy regarding screening and potential treatment outcomes of prostate cancer has been discussed with the patient. The meaning of a false positive PSA and a false negative PSA has been discussed. He indicates understanding of the limitations of this screening test and wishes to proceed with screening PSA testing.   IMMUNIZATIONS:   - Tdap: Tetanus vaccination status reviewed: last tetanus booster within 10 years. - Influenza: Refused - Prevnar: Refused - COVID: Refused - HPV: Not applicable - Shingrix vaccine: Refused  SCREENING: - Colonoscopy: Up to date  Discussed with patient purpose of the colonoscopy is to detect colon cancer at curable precancerous or early stages   PATIENT COUNSELING:    Sexuality: Discussed sexually transmitted diseases, partner selection, use of condoms, avoidance of unintended pregnancy  and contraceptive alternatives.   Advised to avoid cigarette smoking.  I discussed with the patient that most people either abstain from alcohol or drink within safe limits (<=14/week and <=4 drinks/occasion for males, <=7/weeks and <= 3 drinks/occasion for females) and that the risk for alcohol disorders and other health effects rises proportionally with the number of drinks per week and how often a drinker exceeds daily limits.  Discussed cessation/primary prevention of drug use and availability of treatment for abuse.   Diet: Encouraged to adjust caloric intake to maintain  or achieve ideal body weight, to reduce intake of dietary saturated fat and total fat, to limit sodium intake by avoiding high sodium foods and not adding table salt, and to maintain adequate  dietary potassium and calcium preferably from fresh fruits, vegetables, and low-fat dairy products.    stressed the importance of regular exercise  Injury prevention: Discussed safety belts, safety helmets, smoke detector, smoking near bedding or upholstery.   Dental health: Discussed importance of regular tooth brushing, flossing, and dental visits.   Follow up plan: NEXT PREVENTATIVE PHYSICAL DUE IN 1 YEAR. Return in about 6 months (around 07/29/2024).     [1] No Known Allergies [2]  Social History Tobacco Use  Smoking Status Never  Smokeless Tobacco Never   "

## 2024-01-30 NOTE — Assessment & Plan Note (Signed)
 Rechecking labs today. Await results. Treat as needed.

## 2024-01-31 LAB — CBC WITH DIFFERENTIAL/PLATELET
Basophils Absolute: 0 x10E3/uL (ref 0.0–0.2)
Basos: 1 %
EOS (ABSOLUTE): 0.1 x10E3/uL (ref 0.0–0.4)
Eos: 2 %
Hematocrit: 46.2 % (ref 37.5–51.0)
Hemoglobin: 15.6 g/dL (ref 13.0–17.7)
Immature Grans (Abs): 0 x10E3/uL (ref 0.0–0.1)
Immature Granulocytes: 0 %
Lymphocytes Absolute: 1.8 x10E3/uL (ref 0.7–3.1)
Lymphs: 24 %
MCH: 32.6 pg (ref 26.6–33.0)
MCHC: 33.8 g/dL (ref 31.5–35.7)
MCV: 97 fL (ref 79–97)
Monocytes Absolute: 0.6 x10E3/uL (ref 0.1–0.9)
Monocytes: 8 %
Neutrophils Absolute: 4.8 x10E3/uL (ref 1.4–7.0)
Neutrophils: 65 %
Platelets: 213 x10E3/uL (ref 150–450)
RBC: 4.78 x10E6/uL (ref 4.14–5.80)
RDW: 12.5 % (ref 11.6–15.4)
WBC: 7.3 x10E3/uL (ref 3.4–10.8)

## 2024-01-31 LAB — COMPREHENSIVE METABOLIC PANEL WITH GFR
ALT: 13 IU/L (ref 0–44)
AST: 17 IU/L (ref 0–40)
Albumin: 4.1 g/dL (ref 3.9–4.9)
Alkaline Phosphatase: 149 IU/L — ABNORMAL HIGH (ref 47–123)
BUN/Creatinine Ratio: 15 (ref 10–24)
BUN: 12 mg/dL (ref 8–27)
Bilirubin Total: 0.2 mg/dL (ref 0.0–1.2)
CO2: 22 mmol/L (ref 20–29)
Calcium: 8.8 mg/dL (ref 8.6–10.2)
Chloride: 104 mmol/L (ref 96–106)
Creatinine, Ser: 0.82 mg/dL (ref 0.76–1.27)
Globulin, Total: 2.6 g/dL (ref 1.5–4.5)
Glucose: 79 mg/dL (ref 70–99)
Potassium: 4.2 mmol/L (ref 3.5–5.2)
Sodium: 142 mmol/L (ref 134–144)
Total Protein: 6.7 g/dL (ref 6.0–8.5)
eGFR: 100 mL/min/1.73

## 2024-01-31 LAB — LIPID PANEL W/O CHOL/HDL RATIO
Cholesterol, Total: 180 mg/dL (ref 100–199)
HDL: 43 mg/dL
LDL Chol Calc (NIH): 123 mg/dL — ABNORMAL HIGH (ref 0–99)
Triglycerides: 72 mg/dL (ref 0–149)
VLDL Cholesterol Cal: 14 mg/dL (ref 5–40)

## 2024-01-31 LAB — TSH: TSH: 2.29 u[IU]/mL (ref 0.450–4.500)

## 2024-01-31 LAB — PSA: Prostate Specific Ag, Serum: 0.7 ng/mL (ref 0.0–4.0)

## 2024-02-02 ENCOUNTER — Ambulatory Visit: Payer: Self-pay | Admitting: Family Medicine

## 2024-08-10 ENCOUNTER — Ambulatory Visit: Admitting: Family Medicine
